# Patient Record
Sex: Female | Born: 1940
Health system: Southern US, Community
[De-identification: ages and names within clinical notes are randomized; demographics above are authoritative.]

## PROBLEM LIST (undated history)

## (undated) DIAGNOSIS — M8580 Other specified disorders of bone density and structure, unspecified site: Secondary | ICD-10-CM

## (undated) DIAGNOSIS — M1990 Unspecified osteoarthritis, unspecified site: Secondary | ICD-10-CM

## (undated) DIAGNOSIS — F419 Anxiety disorder, unspecified: Secondary | ICD-10-CM

## (undated) DIAGNOSIS — N329 Bladder disorder, unspecified: Secondary | ICD-10-CM

## (undated) DIAGNOSIS — E039 Hypothyroidism, unspecified: Secondary | ICD-10-CM

## (undated) DIAGNOSIS — J45909 Unspecified asthma, uncomplicated: Secondary | ICD-10-CM

## (undated) DIAGNOSIS — F32A Depression, unspecified: Secondary | ICD-10-CM

## (undated) DIAGNOSIS — G4700 Insomnia, unspecified: Secondary | ICD-10-CM

## (undated) DIAGNOSIS — F329 Major depressive disorder, single episode, unspecified: Secondary | ICD-10-CM

## (undated) DIAGNOSIS — E079 Disorder of thyroid, unspecified: Secondary | ICD-10-CM

## (undated) DIAGNOSIS — J309 Allergic rhinitis, unspecified: Secondary | ICD-10-CM

## (undated) HISTORY — DX: Other specified disorders of bone density and structure, unspecified site: M85.80

## (undated) HISTORY — DX: Depression, unspecified: F32.A

## (undated) HISTORY — DX: Allergic rhinitis, unspecified: J30.9

## (undated) HISTORY — DX: Unspecified asthma, uncomplicated: J45.909

## (undated) HISTORY — DX: Bladder disorder, unspecified: N32.9

## (undated) HISTORY — DX: Anxiety disorder, unspecified: F41.9

## (undated) HISTORY — DX: Insomnia, unspecified: G47.00

## (undated) HISTORY — DX: Unspecified osteoarthritis, unspecified site: M19.90

## (undated) HISTORY — DX: Hypothyroidism, unspecified: E03.9

## (undated) HISTORY — PX: COLONOSCOPY: SHX174

## (undated) HISTORY — DX: Disorder of thyroid, unspecified: E07.9

---
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
## 1999-07-17 ENCOUNTER — Encounter: Admission: RE | Admit: 1999-07-17 | Discharge: 1999-07-17 | Payer: Self-pay | Admitting: Obstetrics and Gynecology

## 1999-07-17 ENCOUNTER — Encounter: Payer: Self-pay | Admitting: Obstetrics and Gynecology

## 1999-07-22 ENCOUNTER — Encounter: Admission: RE | Admit: 1999-07-22 | Discharge: 1999-07-22 | Payer: Self-pay | Admitting: Internal Medicine

## 1999-07-22 ENCOUNTER — Encounter (HOSPITAL_BASED_OUTPATIENT_CLINIC_OR_DEPARTMENT_OTHER): Payer: Self-pay | Admitting: Internal Medicine

## 2000-07-22 ENCOUNTER — Encounter: Admission: RE | Admit: 2000-07-22 | Discharge: 2000-07-22 | Payer: Self-pay | Admitting: Obstetrics and Gynecology

## 2000-07-22 ENCOUNTER — Encounter: Payer: Self-pay | Admitting: Obstetrics and Gynecology

## 2000-08-18 ENCOUNTER — Other Ambulatory Visit: Admission: RE | Admit: 2000-08-18 | Discharge: 2000-08-18 | Payer: Self-pay | Admitting: Obstetrics and Gynecology

## 2001-07-24 ENCOUNTER — Encounter: Payer: Self-pay | Admitting: Obstetrics and Gynecology

## 2001-07-24 ENCOUNTER — Encounter: Admission: RE | Admit: 2001-07-24 | Discharge: 2001-07-24 | Payer: Self-pay | Admitting: Obstetrics and Gynecology

## 2001-08-04 ENCOUNTER — Other Ambulatory Visit: Admission: RE | Admit: 2001-08-04 | Discharge: 2001-08-04 | Payer: Self-pay | Admitting: Obstetrics and Gynecology

## 2002-01-01 ENCOUNTER — Ambulatory Visit (HOSPITAL_COMMUNITY): Admission: RE | Admit: 2002-01-01 | Discharge: 2002-01-01 | Payer: Self-pay | Admitting: Internal Medicine

## 2002-01-02 ENCOUNTER — Ambulatory Visit (HOSPITAL_COMMUNITY): Admission: RE | Admit: 2002-01-02 | Discharge: 2002-01-02 | Payer: Self-pay | Admitting: Internal Medicine

## 2002-05-03 ENCOUNTER — Encounter: Admission: RE | Admit: 2002-05-03 | Discharge: 2002-05-03 | Payer: Self-pay | Admitting: Obstetrics and Gynecology

## 2002-05-03 ENCOUNTER — Encounter: Payer: Self-pay | Admitting: Obstetrics and Gynecology

## 2003-05-06 ENCOUNTER — Encounter: Payer: Self-pay | Admitting: Obstetrics and Gynecology

## 2003-05-06 ENCOUNTER — Encounter: Admission: RE | Admit: 2003-05-06 | Discharge: 2003-05-06 | Payer: Self-pay | Admitting: Obstetrics and Gynecology

## 2004-06-19 ENCOUNTER — Encounter: Admission: RE | Admit: 2004-06-19 | Discharge: 2004-06-19 | Payer: Self-pay | Admitting: Obstetrics and Gynecology

## 2004-08-10 ENCOUNTER — Ambulatory Visit: Payer: Self-pay | Admitting: Gastroenterology

## 2004-08-24 ENCOUNTER — Ambulatory Visit: Payer: Self-pay | Admitting: Gastroenterology

## 2005-06-22 ENCOUNTER — Encounter: Admission: RE | Admit: 2005-06-22 | Discharge: 2005-06-22 | Payer: Self-pay | Admitting: Obstetrics and Gynecology

## 2006-06-28 ENCOUNTER — Encounter: Admission: RE | Admit: 2006-06-28 | Discharge: 2006-06-28 | Payer: Self-pay | Admitting: Obstetrics and Gynecology

## 2007-07-04 ENCOUNTER — Encounter: Admission: RE | Admit: 2007-07-04 | Discharge: 2007-07-04 | Payer: Self-pay | Admitting: Obstetrics and Gynecology

## 2008-07-04 ENCOUNTER — Encounter: Admission: RE | Admit: 2008-07-04 | Discharge: 2008-07-04 | Payer: Self-pay | Admitting: Obstetrics and Gynecology

## 2009-09-19 ENCOUNTER — Encounter: Admission: RE | Admit: 2009-09-19 | Discharge: 2009-09-19 | Payer: Self-pay | Admitting: Obstetrics and Gynecology

## 2009-09-22 ENCOUNTER — Encounter: Admission: RE | Admit: 2009-09-22 | Discharge: 2009-09-22 | Payer: Self-pay | Admitting: Obstetrics and Gynecology

## 2010-09-30 ENCOUNTER — Encounter
Admission: RE | Admit: 2010-09-30 | Discharge: 2010-09-30 | Payer: Self-pay | Source: Home / Self Care | Attending: Obstetrics and Gynecology | Admitting: Obstetrics and Gynecology

## 2010-10-30 ENCOUNTER — Telehealth: Payer: Self-pay | Admitting: Gastroenterology

## 2010-11-09 ENCOUNTER — Encounter: Payer: Self-pay | Admitting: Gastroenterology

## 2010-11-19 NOTE — Letter (Signed)
Summary: Pre Visit Letter Revised  [facility]  [address] [place], [place] [address]   Phone: [phone]  Fax: [phone]        11/09/2010 MRN: [id number] [facility] [address] [place], [place]  [address]             Procedure Date:  01/08/11  Welcome to the Gastroenterology Division at [facility].    You are scheduled to see a nurse for your pre-procedure visit on 12/25/10 at 10:00 am on the 3rd floor at [facility], [address]  We ask that you try to arrive at our office 15 minutes prior to your appointment time to allow for check-in.  Please take a minute to review the attached form.  If you answer "Yes" to one or more of the questions on the first page, we ask that you call the person listed at your earliest opportunity.  If you answer "No" to all of the questions, please complete the rest of the form and bring it to your appointment.    Your nurse visit will consist of discussing your medical and surgical history, your immediate family medical history, and your medications.   If you are unable to list all of your medications on the form, please bring the medication bottles to your appointment and we will list them.  We will need to be aware of both prescribed and over the counter drugs.  We will need to know exact dosage information as well.    Please be prepared to read and sign documents such as consent forms, a financial agreement, and acknowledgement forms.  If necessary, and with your consent, a friend or relative is welcome to sit-in on the nurse visit with you.  Please bring your insurance card so that we may make a copy of it.  If your insurance requires a referral to see a specialist, please bring your referral form from your primary care physician.  No co-pay is required for this nurse visit.     If you cannot keep your appointment, please call [phone] to cancel or reschedule prior to your appointment date.  This  allows [place] the opportunity to schedule an appointment for another patient in need of care.    Thank you for choosing [facility] for your medical needs.  We appreciate the opportunity to care for you.  Please visit [place] at our website  to learn more about our practice.  Sincerely, The Gastroenterology Division

## 2010-11-19 NOTE — Progress Notes (Signed)
Summary: Questions  Phone Note Call from Patient Call back at Home Phone [phone]   Caller: Patient Call For: Dr. Russella Dar Reason for Call: Talk to Nurse Summary of Call: Patient says her last colon report from 2005 says to come back in 2012 and the recall in the computer says 2015, wants to speak with a nurse about the possibility of this being a computer error and see if she does need to schedule colon now Initial call taken by: [place] Johnson,  October 30, 2010 3:18 PM  Follow-up for Phone Call        paper chart has been ordered for review Darcey Nora RN, [facility]  November 02, 2010 3:30 PM  I spoke with the patient she is concerned because her brother had colon CA at age 70.  Last colon was 08/2004 showed only internal hems.  Please advise if recall is still for 08/2014.  I will put the paper chart in your office to review Follow-up by: Darcey Nora RN, CGRN,  November 04, 2010 9:03 AM  Additional Follow-up for Phone Call Additional follow up Details #1::        Records reviewed. At 08/2004 colonoscopy she was a routine risk screening, we did not have a family history of colon cancer.  Since she now reports she has a brother at 81 with colon cancer she is due at 5 years from her prior exam which is overdue. Please direct schedule her colonoscopy. Additional Follow-up by: Meryl Dare MD Clementeen Graham,  November 04, 2010 12:02 PM    Additional Follow-up for Phone Call Additional follow up Details #2::    Left message for patient to call back Darcey Nora RN, [facility]  November 05, 2010 2:58 PM Left message for patient to call back Darcey Nora RN, [facility]  November 06, 2010 4:28 PM  Patient is scheduled for colon 01/08/11 8:30 [facility], pre-visit is scheduled for 12/25/10 10:00 Follow-up by: Darcey Nora RN, CGRN,  November 09, 2010 12:10 PM

## 2010-12-25 ENCOUNTER — Ambulatory Visit (AMBULATORY_SURGERY_CENTER): Payer: Medicare Other | Admitting: *Deleted

## 2010-12-25 VITALS — Ht 60.0 in | Wt 113.9 lb

## 2010-12-25 DIAGNOSIS — Z800 Family history of malignant neoplasm of digestive organs: Secondary | ICD-10-CM

## 2010-12-25 MED ORDER — PEG-KCL-NACL-NASULF-NA ASC-C 100 G PO SOLR: ORAL | Status: DC

## 2011-01-08 ENCOUNTER — Ambulatory Visit (AMBULATORY_SURGERY_CENTER): Payer: Medicare Other | Admitting: Gastroenterology

## 2011-01-08 ENCOUNTER — Encounter: Payer: Self-pay | Admitting: Gastroenterology

## 2011-01-08 DIAGNOSIS — K648 Other hemorrhoids: Secondary | ICD-10-CM

## 2011-01-08 DIAGNOSIS — Z1211 Encounter for screening for malignant neoplasm of colon: Secondary | ICD-10-CM

## 2011-01-08 DIAGNOSIS — Z800 Family history of malignant neoplasm of digestive organs: Secondary | ICD-10-CM

## 2011-01-08 DIAGNOSIS — K5730 Diverticulosis of large intestine without perforation or abscess without bleeding: Secondary | ICD-10-CM

## 2011-01-08 MED ORDER — SODIUM CHLORIDE 0.9 % IV SOLN: 500.0000 mL | INTRAVENOUS | Status: DC

## 2011-01-08 NOTE — Patient Instructions (Signed)
Refer to green and blue discharge instruction sheets. 

## 2011-01-08 NOTE — Progress Notes (Signed)
0454 MS gave verbal and written report to pt's care partner.

## 2011-01-11 ENCOUNTER — Telehealth: Payer: Self-pay | Admitting: *Deleted

## 2011-01-11 NOTE — Telephone Encounter (Signed)

## 2011-09-23 ENCOUNTER — Other Ambulatory Visit: Payer: Self-pay | Admitting: Obstetrics and Gynecology

## 2011-09-23 DIAGNOSIS — N63 Unspecified lump in unspecified breast: Secondary | ICD-10-CM

## 2011-10-04 ENCOUNTER — Ambulatory Visit
Admission: RE | Admit: 2011-10-04 | Discharge: 2011-10-04 | Disposition: A | Discharge status: Home / Self Care | Payer: Medicare Other | Source: Ambulatory Visit | Attending: Obstetrics and Gynecology | Admitting: Obstetrics and Gynecology

## 2011-10-04 DIAGNOSIS — N63 Unspecified lump in unspecified breast: Secondary | ICD-10-CM

## 2011-10-04 DIAGNOSIS — R928 Other abnormal and inconclusive findings on diagnostic imaging of breast: Secondary | ICD-10-CM | POA: Diagnosis not present

## 2011-10-08 DIAGNOSIS — Z0000 Encounter for general adult medical examination without abnormal findings: Secondary | ICD-10-CM | POA: Diagnosis not present

## 2011-10-08 DIAGNOSIS — Z124 Encounter for screening for malignant neoplasm of cervix: Secondary | ICD-10-CM | POA: Diagnosis not present

## 2011-10-12 DIAGNOSIS — E039 Hypothyroidism, unspecified: Secondary | ICD-10-CM | POA: Diagnosis not present

## 2011-10-12 DIAGNOSIS — E785 Hyperlipidemia, unspecified: Secondary | ICD-10-CM | POA: Diagnosis not present

## 2011-10-12 DIAGNOSIS — Z780 Asymptomatic menopausal state: Secondary | ICD-10-CM | POA: Diagnosis not present

## 2011-10-12 DIAGNOSIS — G4700 Insomnia, unspecified: Secondary | ICD-10-CM | POA: Diagnosis not present

## 2011-10-12 DIAGNOSIS — R82998 Other abnormal findings in urine: Secondary | ICD-10-CM | POA: Diagnosis not present

## 2011-10-19 DIAGNOSIS — G4700 Insomnia, unspecified: Secondary | ICD-10-CM | POA: Diagnosis not present

## 2011-10-19 DIAGNOSIS — J45909 Unspecified asthma, uncomplicated: Secondary | ICD-10-CM | POA: Diagnosis not present

## 2011-10-19 DIAGNOSIS — E785 Hyperlipidemia, unspecified: Secondary | ICD-10-CM | POA: Diagnosis not present

## 2011-10-19 DIAGNOSIS — Z0000 Encounter for general adult medical examination without abnormal findings: Secondary | ICD-10-CM | POA: Diagnosis not present

## 2011-11-03 DIAGNOSIS — D485 Neoplasm of uncertain behavior of skin: Secondary | ICD-10-CM | POA: Diagnosis not present

## 2011-11-03 DIAGNOSIS — L821 Other seborrheic keratosis: Secondary | ICD-10-CM | POA: Diagnosis not present

## 2011-11-03 DIAGNOSIS — C44319 Basal cell carcinoma of skin of other parts of face: Secondary | ICD-10-CM | POA: Diagnosis not present

## 2011-11-03 DIAGNOSIS — C44519 Basal cell carcinoma of skin of other part of trunk: Secondary | ICD-10-CM | POA: Diagnosis not present

## 2011-11-03 DIAGNOSIS — D239 Other benign neoplasm of skin, unspecified: Secondary | ICD-10-CM | POA: Diagnosis not present

## 2011-11-05 DIAGNOSIS — C44319 Basal cell carcinoma of skin of other parts of face: Secondary | ICD-10-CM | POA: Diagnosis not present

## 2011-11-05 DIAGNOSIS — C44519 Basal cell carcinoma of skin of other part of trunk: Secondary | ICD-10-CM | POA: Diagnosis not present

## 2011-11-17 DIAGNOSIS — H18899 Other specified disorders of cornea, unspecified eye: Secondary | ICD-10-CM | POA: Diagnosis not present

## 2011-11-17 DIAGNOSIS — H5710 Ocular pain, unspecified eye: Secondary | ICD-10-CM | POA: Diagnosis not present

## 2011-11-24 DIAGNOSIS — C44519 Basal cell carcinoma of skin of other part of trunk: Secondary | ICD-10-CM | POA: Diagnosis not present

## 2011-12-01 DIAGNOSIS — H04129 Dry eye syndrome of unspecified lacrimal gland: Secondary | ICD-10-CM | POA: Diagnosis not present

## 2011-12-01 DIAGNOSIS — D487 Neoplasm of uncertain behavior of other specified sites: Secondary | ICD-10-CM | POA: Diagnosis not present

## 2011-12-06 DIAGNOSIS — C44319 Basal cell carcinoma of skin of other parts of face: Secondary | ICD-10-CM | POA: Diagnosis not present

## 2011-12-08 DIAGNOSIS — L570 Actinic keratosis: Secondary | ICD-10-CM | POA: Diagnosis not present

## 2012-03-29 DIAGNOSIS — L909 Atrophic disorder of skin, unspecified: Secondary | ICD-10-CM | POA: Diagnosis not present

## 2012-03-29 DIAGNOSIS — L570 Actinic keratosis: Secondary | ICD-10-CM | POA: Diagnosis not present

## 2012-03-29 DIAGNOSIS — D239 Other benign neoplasm of skin, unspecified: Secondary | ICD-10-CM | POA: Diagnosis not present

## 2012-03-29 DIAGNOSIS — Z85828 Personal history of other malignant neoplasm of skin: Secondary | ICD-10-CM | POA: Diagnosis not present

## 2012-03-29 DIAGNOSIS — L820 Inflamed seborrheic keratosis: Secondary | ICD-10-CM | POA: Diagnosis not present

## 2012-03-29 DIAGNOSIS — L910 Hypertrophic scar: Secondary | ICD-10-CM | POA: Diagnosis not present

## 2012-04-18 DIAGNOSIS — M949 Disorder of cartilage, unspecified: Secondary | ICD-10-CM | POA: Diagnosis not present

## 2012-04-18 DIAGNOSIS — E785 Hyperlipidemia, unspecified: Secondary | ICD-10-CM | POA: Diagnosis not present

## 2012-04-18 DIAGNOSIS — Z23 Encounter for immunization: Secondary | ICD-10-CM | POA: Diagnosis not present

## 2012-04-18 DIAGNOSIS — M899 Disorder of bone, unspecified: Secondary | ICD-10-CM | POA: Diagnosis not present

## 2012-04-18 DIAGNOSIS — E039 Hypothyroidism, unspecified: Secondary | ICD-10-CM | POA: Diagnosis not present

## 2012-05-17 DIAGNOSIS — L719 Rosacea, unspecified: Secondary | ICD-10-CM | POA: Diagnosis not present

## 2012-05-17 DIAGNOSIS — C44519 Basal cell carcinoma of skin of other part of trunk: Secondary | ICD-10-CM | POA: Diagnosis not present

## 2012-05-17 DIAGNOSIS — L570 Actinic keratosis: Secondary | ICD-10-CM | POA: Diagnosis not present

## 2012-05-17 DIAGNOSIS — L219 Seborrheic dermatitis, unspecified: Secondary | ICD-10-CM | POA: Diagnosis not present

## 2012-06-14 DIAGNOSIS — C44519 Basal cell carcinoma of skin of other part of trunk: Secondary | ICD-10-CM | POA: Diagnosis not present

## 2012-07-12 DIAGNOSIS — Z23 Encounter for immunization: Secondary | ICD-10-CM | POA: Diagnosis not present

## 2012-09-21 ENCOUNTER — Other Ambulatory Visit: Payer: Self-pay | Admitting: Obstetrics and Gynecology

## 2012-09-21 DIAGNOSIS — Z1231 Encounter for screening mammogram for malignant neoplasm of breast: Secondary | ICD-10-CM

## 2012-09-21 DIAGNOSIS — Z853 Personal history of malignant neoplasm of breast: Secondary | ICD-10-CM

## 2012-10-05 ENCOUNTER — Ambulatory Visit
Admission: RE | Admit: 2012-10-05 | Discharge: 2012-10-05 | Disposition: A | Discharge status: Home / Self Care | Payer: Medicare Other | Source: Ambulatory Visit | Attending: Obstetrics and Gynecology | Admitting: Obstetrics and Gynecology

## 2012-10-05 DIAGNOSIS — Z1231 Encounter for screening mammogram for malignant neoplasm of breast: Secondary | ICD-10-CM

## 2012-10-05 DIAGNOSIS — Z853 Personal history of malignant neoplasm of breast: Secondary | ICD-10-CM

## 2012-10-12 DIAGNOSIS — Z01419 Encounter for gynecological examination (general) (routine) without abnormal findings: Secondary | ICD-10-CM | POA: Diagnosis not present

## 2012-10-12 DIAGNOSIS — Z124 Encounter for screening for malignant neoplasm of cervix: Secondary | ICD-10-CM | POA: Diagnosis not present

## 2012-10-12 DIAGNOSIS — Z0000 Encounter for general adult medical examination without abnormal findings: Secondary | ICD-10-CM | POA: Diagnosis not present

## 2012-10-19 DIAGNOSIS — D485 Neoplasm of uncertain behavior of skin: Secondary | ICD-10-CM | POA: Diagnosis not present

## 2012-10-19 DIAGNOSIS — D235 Other benign neoplasm of skin of trunk: Secondary | ICD-10-CM | POA: Diagnosis not present

## 2012-10-19 DIAGNOSIS — L84 Corns and callosities: Secondary | ICD-10-CM | POA: Diagnosis not present

## 2012-10-19 DIAGNOSIS — L570 Actinic keratosis: Secondary | ICD-10-CM | POA: Diagnosis not present

## 2012-10-19 DIAGNOSIS — Z85828 Personal history of other malignant neoplasm of skin: Secondary | ICD-10-CM | POA: Diagnosis not present

## 2012-10-31 DIAGNOSIS — M949 Disorder of cartilage, unspecified: Secondary | ICD-10-CM | POA: Diagnosis not present

## 2012-10-31 DIAGNOSIS — E039 Hypothyroidism, unspecified: Secondary | ICD-10-CM | POA: Diagnosis not present

## 2012-10-31 DIAGNOSIS — R82998 Other abnormal findings in urine: Secondary | ICD-10-CM | POA: Diagnosis not present

## 2012-10-31 DIAGNOSIS — M899 Disorder of bone, unspecified: Secondary | ICD-10-CM | POA: Diagnosis not present

## 2012-10-31 DIAGNOSIS — E785 Hyperlipidemia, unspecified: Secondary | ICD-10-CM | POA: Diagnosis not present

## 2012-11-02 DIAGNOSIS — L570 Actinic keratosis: Secondary | ICD-10-CM | POA: Diagnosis not present

## 2012-11-06 DIAGNOSIS — M949 Disorder of cartilage, unspecified: Secondary | ICD-10-CM | POA: Diagnosis not present

## 2012-11-06 DIAGNOSIS — R32 Unspecified urinary incontinence: Secondary | ICD-10-CM | POA: Diagnosis not present

## 2012-11-06 DIAGNOSIS — Z0000 Encounter for general adult medical examination without abnormal findings: Secondary | ICD-10-CM | POA: Diagnosis not present

## 2012-11-06 DIAGNOSIS — M899 Disorder of bone, unspecified: Secondary | ICD-10-CM | POA: Diagnosis not present

## 2012-11-06 DIAGNOSIS — G4700 Insomnia, unspecified: Secondary | ICD-10-CM | POA: Diagnosis not present

## 2012-11-10 DIAGNOSIS — Z1212 Encounter for screening for malignant neoplasm of rectum: Secondary | ICD-10-CM | POA: Diagnosis not present

## 2012-12-07 DIAGNOSIS — H2510 Age-related nuclear cataract, unspecified eye: Secondary | ICD-10-CM | POA: Diagnosis not present

## 2012-12-07 DIAGNOSIS — H43819 Vitreous degeneration, unspecified eye: Secondary | ICD-10-CM | POA: Diagnosis not present

## 2012-12-07 DIAGNOSIS — H01009 Unspecified blepharitis unspecified eye, unspecified eyelid: Secondary | ICD-10-CM | POA: Diagnosis not present

## 2012-12-07 DIAGNOSIS — H04129 Dry eye syndrome of unspecified lacrimal gland: Secondary | ICD-10-CM | POA: Diagnosis not present

## 2013-05-24 DIAGNOSIS — J45909 Unspecified asthma, uncomplicated: Secondary | ICD-10-CM | POA: Diagnosis not present

## 2013-05-24 DIAGNOSIS — E039 Hypothyroidism, unspecified: Secondary | ICD-10-CM | POA: Diagnosis not present

## 2013-05-24 DIAGNOSIS — Z1331 Encounter for screening for depression: Secondary | ICD-10-CM | POA: Diagnosis not present

## 2013-05-24 DIAGNOSIS — J309 Allergic rhinitis, unspecified: Secondary | ICD-10-CM | POA: Diagnosis not present

## 2013-05-24 DIAGNOSIS — M1990 Unspecified osteoarthritis, unspecified site: Secondary | ICD-10-CM | POA: Diagnosis not present

## 2013-05-24 DIAGNOSIS — Z23 Encounter for immunization: Secondary | ICD-10-CM | POA: Diagnosis not present

## 2013-05-24 DIAGNOSIS — E785 Hyperlipidemia, unspecified: Secondary | ICD-10-CM | POA: Diagnosis not present

## 2013-05-24 DIAGNOSIS — F3289 Other specified depressive episodes: Secondary | ICD-10-CM | POA: Diagnosis not present

## 2013-05-24 DIAGNOSIS — F329 Major depressive disorder, single episode, unspecified: Secondary | ICD-10-CM | POA: Diagnosis not present

## 2013-09-11 ENCOUNTER — Other Ambulatory Visit: Payer: Self-pay

## 2013-09-11 DIAGNOSIS — Z1231 Encounter for screening mammogram for malignant neoplasm of breast: Secondary | ICD-10-CM

## 2013-09-14 DIAGNOSIS — D2370 Other benign neoplasm of skin of unspecified lower limb, including hip: Secondary | ICD-10-CM | POA: Diagnosis not present

## 2013-09-14 DIAGNOSIS — D1801 Hemangioma of skin and subcutaneous tissue: Secondary | ICD-10-CM | POA: Diagnosis not present

## 2013-09-14 DIAGNOSIS — D485 Neoplasm of uncertain behavior of skin: Secondary | ICD-10-CM | POA: Diagnosis not present

## 2013-09-14 DIAGNOSIS — L821 Other seborrheic keratosis: Secondary | ICD-10-CM | POA: Diagnosis not present

## 2013-09-14 DIAGNOSIS — L570 Actinic keratosis: Secondary | ICD-10-CM | POA: Diagnosis not present

## 2013-09-14 DIAGNOSIS — L819 Disorder of pigmentation, unspecified: Secondary | ICD-10-CM | POA: Diagnosis not present

## 2013-09-14 DIAGNOSIS — Z85828 Personal history of other malignant neoplasm of skin: Secondary | ICD-10-CM | POA: Diagnosis not present

## 2013-09-14 DIAGNOSIS — D239 Other benign neoplasm of skin, unspecified: Secondary | ICD-10-CM | POA: Diagnosis not present

## 2013-10-08 ENCOUNTER — Ambulatory Visit
Admission: RE | Admit: 2013-10-08 | Discharge: 2013-10-08 | Disposition: A | Discharge status: Home / Self Care | Payer: Medicare Other | Source: Ambulatory Visit

## 2013-10-08 DIAGNOSIS — Z1231 Encounter for screening mammogram for malignant neoplasm of breast: Secondary | ICD-10-CM | POA: Diagnosis not present

## 2013-10-17 DIAGNOSIS — Z124 Encounter for screening for malignant neoplasm of cervix: Secondary | ICD-10-CM | POA: Diagnosis not present

## 2013-10-17 DIAGNOSIS — Z01419 Encounter for gynecological examination (general) (routine) without abnormal findings: Secondary | ICD-10-CM | POA: Diagnosis not present

## 2013-10-17 DIAGNOSIS — Z0000 Encounter for general adult medical examination without abnormal findings: Secondary | ICD-10-CM | POA: Diagnosis not present

## 2013-10-26 ENCOUNTER — Emergency Department (HOSPITAL_COMMUNITY): Payer: Medicare Other

## 2013-10-26 ENCOUNTER — Encounter (HOSPITAL_COMMUNITY): Payer: Self-pay | Admitting: Emergency Medicine

## 2013-10-26 ENCOUNTER — Emergency Department (HOSPITAL_COMMUNITY)
Admission: EM | Admit: 2013-10-26 | Discharge: 2013-10-27 | Disposition: A | Discharge status: Home / Self Care | Payer: Medicare Other | Attending: Emergency Medicine | Admitting: Emergency Medicine

## 2013-10-26 DIAGNOSIS — E079 Disorder of thyroid, unspecified: Secondary | ICD-10-CM | POA: Insufficient documentation

## 2013-10-26 DIAGNOSIS — Z87448 Personal history of other diseases of urinary system: Secondary | ICD-10-CM | POA: Diagnosis not present

## 2013-10-26 DIAGNOSIS — Z8669 Personal history of other diseases of the nervous system and sense organs: Secondary | ICD-10-CM | POA: Diagnosis not present

## 2013-10-26 DIAGNOSIS — Z7982 Long term (current) use of aspirin: Secondary | ICD-10-CM | POA: Insufficient documentation

## 2013-10-26 DIAGNOSIS — Z79899 Other long term (current) drug therapy: Secondary | ICD-10-CM | POA: Diagnosis not present

## 2013-10-26 DIAGNOSIS — R0789 Other chest pain: Secondary | ICD-10-CM | POA: Diagnosis not present

## 2013-10-26 DIAGNOSIS — R079 Chest pain, unspecified: Secondary | ICD-10-CM | POA: Diagnosis not present

## 2013-10-26 LAB — BASIC METABOLIC PANEL
BUN: 17 mg/dL (ref 6–23)
CALCIUM: 9.8 mg/dL (ref 8.4–10.5)
CO2: 23 meq/L (ref 19–32)
CREATININE: 0.63 mg/dL (ref 0.50–1.10)
Chloride: 104 mEq/L (ref 96–112)
GFR calc Af Amer: >90 mL/min (ref >90)
GFR calc non Af Amer: 87 mL/min — ABNORMAL LOW (ref >90)
Glucose, Bld: 98 mg/dL (ref 70–99)
Potassium: 3.8 mEq/L (ref 3.7–5.3)
Sodium: 142 mEq/L (ref 137–147)

## 2013-10-26 LAB — HEPATIC FUNCTION PANEL
ALT: 22 U/L (ref 0–35)
AST: 28 U/L (ref 0–37)
Albumin: 3.9 g/dL (ref 3.5–5.2)
Alkaline Phosphatase: 69 U/L (ref 39–117)
Bilirubin, Direct: <0.2 mg/dL (ref 0.0–0.3)
Total Bilirubin: 0.4 mg/dL (ref 0.3–1.2)
Total Protein: 7.1 g/dL (ref 6.0–8.3)

## 2013-10-26 LAB — CBC
HCT: 37.1 % (ref 36.0–46.0)
HEMOGLOBIN: 12.6 g/dL (ref 12.0–15.0)
MCH: 29.8 pg (ref 26.0–34.0)
MCHC: 34 g/dL (ref 30.0–36.0)
MCV: 87.7 fL (ref 78.0–100.0)
Platelets: 185 10*3/uL (ref 150–400)
RBC: 4.23 MIL/uL (ref 3.87–5.11)
RDW: 13.5 % (ref 11.5–15.5)
WBC: 7.2 10*3/uL (ref 4.0–10.5)

## 2013-10-26 LAB — POCT I-STAT TROPONIN I
Troponin i, poc: 0.01 ng/mL (ref 0.00–0.08)
Troponin i, poc: 0 ng/mL (ref 0.00–0.08)

## 2013-10-26 LAB — LIPASE, BLOOD: Lipase: 23 U/L (ref 11–59)

## 2013-10-26 MED ORDER — ASPIRIN 81 MG PO CHEW
324.0000 mg | CHEWABLE_TABLET | Freq: Once | ORAL | Status: AC
  Administered 2013-10-26: 324 mg via ORAL
  Filled 2013-10-26: qty 4

## 2013-10-26 MED ORDER — NITROGLYCERIN 0.4 MG SL SUBL
0.4000 mg | SUBLINGUAL_TABLET | SUBLINGUAL | Status: DC | PRN
Start: 2013-10-26 — End: 2013-10-27

## 2013-10-26 NOTE — ED Provider Notes (Addendum)
CSN: [id number]     Arrival date & time 10/26/13  1927 History   First MD Initiated Contact with Patient 10/26/13 1956     Chief Complaint  Patient presents with  . Chest Pain     (Consider location/radiation/quality/duration/timing/severity/associated sxs/prior Treatment) Patient is a 73 y.o. female presenting with chest pain. The history is provided by the patient and the spouse.  Chest Pain Pain location:  Substernal area Pain quality: aching and pressure   Pain radiates to:  Mid back Pain radiates to the back: yes   Pain severity:  Mild Onset quality:  Gradual Duration:  24 hours Timing:  Intermittent Progression:  Partially resolved Chronicity:  New Context comment:  Can't seem to find anything that will make it worse Relieved by: better when she gets up and walks around. Worsened by:  Nothing tried Associated symptoms: no abdominal pain, no cough, no diaphoresis, no fever, no nausea, no palpitations, no shortness of breath, not vomiting and no weakness   Risk factors: no coronary artery disease, no diabetes mellitus, no high cholesterol, no hypertension, no immobilization, no smoking and no surgery     Past Medical History  Diagnosis Date  . Cataract     begining of one  . Thyroid disease   . Bladder problem     bladder leakage at times   Past Surgical History  Procedure Laterality Date  . Wisdom tooth extraction    . Colonoscopy     Family History  Problem Relation Age of Onset  . Colon cancer Brother 36  . Esophageal cancer Maternal Uncle   . Lung cancer Brother   . Cancer Mother   . Stroke Mother   . Heart disease Father    History  Substance Use Topics  . Smoking status: Never Smoker   . Smokeless tobacco: Never Used  . Alcohol Use: No   OB History   Grav Para Term Preterm Abortions TAB SAB Ect Mult Living                 Review of Systems  Constitutional: Negative for fever and diaphoresis.  Respiratory: Negative for cough and shortness of  breath.   Cardiovascular: Positive for chest pain. Negative for palpitations.  Gastrointestinal: Negative for nausea, vomiting and abdominal pain.  Neurological: Negative for weakness.  All other systems reviewed and are negative.      Allergies  Review of patient's allergies indicates no known allergies.  Home Medications   Current Outpatient Rx  Name  Route  Sig  Dispense  Refill  . acetaminophen (TYLENOL) 500 MG tablet   Oral   Take 500 mg by mouth at bedtime.          Marland Kitchen aspirin EC 81 MG tablet   Oral   Take 81 mg by mouth every evening.         . Coenzyme Q10 (COQ10) 100 MG CAPS   Oral   Take 100 mg by mouth daily.         Marland Kitchen desonide (DESOWEN) 0.05 % cream   Topical   Apply 1 application topically daily as needed (for ear itching).         Marland Kitchen guaiFENesin (ROBITUSSIN) 100 MG/5ML liquid   Oral   Take 100 mg by mouth 3 (three) times daily as needed for cough.         Marland Kitchen ibuprofen (ADVIL,MOTRIN) 200 MG tablet   Oral   Take 200 mg by mouth as needed for moderate pain.          Marland Kitchen  levothyroxine (SYNTHROID, LEVOTHROID) 112 MCG tablet   Oral   Take 112 mcg by mouth daily before breakfast.          . LORazepam (ATIVAN) 0.5 MG tablet   Oral   Take 0.5 mg by mouth at bedtime.          . Multiple Vitamins-Minerals (CENTRUM SILVER PO)   Oral   Take 1 tablet by mouth daily.           . Omega-3 Fatty Acids (FISH OIL) 1200 MG CAPS   Oral   Take 1 capsule by mouth 2 (two) times daily.          Vladimir Faster Glycol-Propyl Glycol (SYSTANE) 0.4-0.3 % SOLN   Ophthalmic   Apply 1 drop to eye 4 (four) times daily.          BP 123/55  Pulse 62  Temp(Src) 97.6 F (36.4 C)  Resp 16  SpO2 97% Physical Exam  Nursing note and vitals reviewed. Constitutional: She is oriented to person, place, and time. She appears well-developed and well-nourished. No distress.  HENT:  Head: Normocephalic and atraumatic.  Mouth/Throat: Oropharynx is clear and moist.   Eyes: Conjunctivae and EOM are normal. Pupils are equal, round, and reactive to light.  Neck: Normal range of motion. Neck supple.  Cardiovascular: Normal rate, regular rhythm and intact distal pulses.   No murmur heard. Pulmonary/Chest: Effort normal and breath sounds normal. No respiratory distress. She has no wheezes. She has no rales. She exhibits no tenderness.  Abdominal: Soft. She exhibits no distension. There is no tenderness. There is no rebound and no guarding.  Musculoskeletal: Normal range of motion. She exhibits no edema and no tenderness.  Neurological: She is alert and oriented to person, place, and time.  Skin: Skin is warm and dry. No rash noted. No erythema.  Psychiatric: She has a normal mood and affect. Her behavior is normal.    ED Course  Procedures (including critical care time) Labs Review Labs Reviewed  BASIC METABOLIC PANEL - Abnormal; Notable for the following:    GFR calc non Af Amer 87 (*)    All other components within normal limits  CBC  HEPATIC FUNCTION PANEL  LIPASE, BLOOD  POCT I-STAT TROPONIN I  POCT I-STAT TROPONIN I   Imaging Review Dg Chest Port 1 View  10/26/2013   CLINICAL DATA:  Chest pain.  EXAM: PORTABLE CHEST - 1 VIEW  COMPARISON:  None.  FINDINGS: The heart size and mediastinal contours are within normal limits. Both lungs are clear. No pleural effusion or pneumothorax is noted. The visualized skeletal structures are unremarkable.  IMPRESSION: No acute cardiopulmonary abnormality seen.   Electronically Signed   By: Sabino Dick M.D.   On: 10/26/2013 20:14    EKG Interpretation    Date/Time:  Friday October 26 2013 19:38:05 EST Ventricular Rate:  68 PR Interval:  130 QRS Duration: 96 QT Interval:  370 QTC Calculation: 393 R Axis:   -56 Text Interpretation:  Normal sinus rhythm Incomplete right bundle branch block Left anterior fascicular block No previous tracing Confirmed by Maryan Rued  MD, Alexxander Kurt ([id number]) on 10/26/2013 7:55:45  PM            MDM   Final diagnoses:  Atypical chest pain    Patient presents today with a 24-hour history of intermittent substernal chest pain that has a minimal radiation into her back without associated symptoms. Pain seems to be better when she gets up and walks but  eating does not seem to make it worse. She has no abnormal tenderness no prior cardiac history. No risk factors for PE other than her age but no recent immobilization or travel. The pain is not pleuritic were suggestive of a PE at this time. Patient does have a significant history of a 12 pound weight loss in the last one year without trying she is already a small woman. However thyroid levels were checked within the last 6 months and were normal she has no symptoms suggestive of hyperthyroidism and she has a pulse 60-70 and normal BP.  No abd pain on exam concerning for pancreatitis and labs CBC, CMP, lipase, trop are wnl.  Nonspecific t-wave inversion on EKG without old to compare.  Pt is not in acute distress.  She was given ASA and pain resolved prior to any NTG being given.    Will check delta trop.   12:06 AM Second trop neg.  Discussed with Dr. Radene Gunning.  Feel pt can be d/ced home safely and given strict return precautions andwill f/u with [facility] this week.  Blanchie Dessert, MD 10/27/13 5102  Blanchie Dessert, MD 10/27/13 5852

## 2013-10-26 NOTE — ED Notes (Signed)
Repeat ISTAT trop I drawn and taken to mini lab.

## 2013-10-26 NOTE — ED Notes (Signed)
Pt experienced an episode of chest tightness that lasted approx 10 minutes not exceeding 4/10 and did not inform RN.  Pt now states she is pain free.  Instructed to inform RN immediately if any further changes.

## 2013-10-26 NOTE — ED Notes (Signed)
Reports tightness in center of chest that radiates to back since last night.  Denies sob, nausea, vomiting, or any other symptoms.  Pt tearful at triage.  Rates pain 2/10 and states at worst it was 4/10.

## 2013-10-27 NOTE — Discharge Instructions (Signed)
Chest Pain (Nonspecific) °Chest pain has many causes. Your pain could be caused by something serious, such as a heart attack or a blood clot in the lungs. It could also be caused by something less serious, such as a chest bruise or a virus. Follow up with your doctor. More lab tests or other studies may be needed to find the cause of your pain. Most of the time, nonspecific chest pain will improve within 2 to 3 days of rest and mild pain medicine. °HOME CARE °· For chest bruises, you may put ice on the sore area for 15-20 minutes, 03-04 times a day. Do this only if it makes you feel better. °· Put ice in a plastic bag. °· Place a towel between the skin and the bag. °· Rest for the next 2 to 3 days. °· Go back to work if the pain improves. °· See your doctor if the pain lasts longer than 1 to 2 weeks. °· Only take medicine as told by your doctor. °· Quit smoking if you smoke. °GET HELP RIGHT AWAY IF:  °· There is more pain or pain that spreads to the arm, neck, jaw, back, or belly (abdomen). °· You have shortness of breath. °· You cough more than usual or cough up blood. °· You have very bad back or belly pain, feel sick to your stomach (nauseous), or throw up (vomit). °· You have very bad weakness. °· You pass out (faint). °· You have a fever. °Any of these problems may be serious and may be an emergency. Do not wait to see if the problems will go away. Get medical help right away. Call your local emergency services 911 in [place] Do not drive yourself to the hospital. °MAKE SURE YOU:  °· Understand these instructions. °· Will watch this condition. °· Will get help right away if you or your child is not doing well or gets worse. °Document Released: 02/16/2008 Document Revised: 11/22/2011 Document Reviewed: 02/16/2008 °Exit[employer]® Patient Information ©2014 [employer]. ° °

## 2013-11-15 DIAGNOSIS — M899 Disorder of bone, unspecified: Secondary | ICD-10-CM | POA: Diagnosis not present

## 2013-11-15 DIAGNOSIS — R82998 Other abnormal findings in urine: Secondary | ICD-10-CM | POA: Diagnosis not present

## 2013-11-15 DIAGNOSIS — E039 Hypothyroidism, unspecified: Secondary | ICD-10-CM | POA: Diagnosis not present

## 2013-11-15 DIAGNOSIS — E785 Hyperlipidemia, unspecified: Secondary | ICD-10-CM | POA: Diagnosis not present

## 2013-11-15 DIAGNOSIS — M949 Disorder of cartilage, unspecified: Secondary | ICD-10-CM | POA: Diagnosis not present

## 2013-11-22 DIAGNOSIS — Z1212 Encounter for screening for malignant neoplasm of rectum: Secondary | ICD-10-CM | POA: Diagnosis not present

## 2013-11-22 DIAGNOSIS — M949 Disorder of cartilage, unspecified: Secondary | ICD-10-CM | POA: Diagnosis not present

## 2013-11-22 DIAGNOSIS — Z0000 Encounter for general adult medical examination without abnormal findings: Secondary | ICD-10-CM | POA: Diagnosis not present

## 2013-11-22 DIAGNOSIS — F329 Major depressive disorder, single episode, unspecified: Secondary | ICD-10-CM | POA: Diagnosis not present

## 2013-11-22 DIAGNOSIS — R634 Abnormal weight loss: Secondary | ICD-10-CM | POA: Diagnosis not present

## 2013-11-22 DIAGNOSIS — F3289 Other specified depressive episodes: Secondary | ICD-10-CM | POA: Diagnosis not present

## 2013-11-22 DIAGNOSIS — R0789 Other chest pain: Secondary | ICD-10-CM | POA: Diagnosis not present

## 2013-11-22 DIAGNOSIS — E785 Hyperlipidemia, unspecified: Secondary | ICD-10-CM | POA: Diagnosis not present

## 2013-11-22 DIAGNOSIS — M899 Disorder of bone, unspecified: Secondary | ICD-10-CM | POA: Diagnosis not present

## 2013-11-22 DIAGNOSIS — E039 Hypothyroidism, unspecified: Secondary | ICD-10-CM | POA: Diagnosis not present

## 2013-11-22 DIAGNOSIS — J309 Allergic rhinitis, unspecified: Secondary | ICD-10-CM | POA: Diagnosis not present

## 2014-05-03 DIAGNOSIS — N644 Mastodynia: Secondary | ICD-10-CM | POA: Diagnosis not present

## 2014-05-06 DIAGNOSIS — Z1331 Encounter for screening for depression: Secondary | ICD-10-CM | POA: Diagnosis not present

## 2014-05-06 DIAGNOSIS — IMO0002 Reserved for concepts with insufficient information to code with codable children: Secondary | ICD-10-CM | POA: Diagnosis not present

## 2014-05-06 DIAGNOSIS — R0789 Other chest pain: Secondary | ICD-10-CM | POA: Diagnosis not present

## 2014-05-06 DIAGNOSIS — N644 Mastodynia: Secondary | ICD-10-CM | POA: Diagnosis not present

## 2014-05-13 ENCOUNTER — Encounter: Payer: Self-pay | Admitting: *Deleted

## 2014-05-13 ENCOUNTER — Ambulatory Visit (INDEPENDENT_AMBULATORY_CARE_PROVIDER_SITE_OTHER): Payer: Medicare Other | Admitting: Cardiology

## 2014-05-13 VITALS — BP 110/58 | HR 59 | Ht 60.0 in | Wt 100.0 lb

## 2014-05-13 DIAGNOSIS — R072 Precordial pain: Secondary | ICD-10-CM

## 2014-05-13 DIAGNOSIS — R079 Chest pain, unspecified: Secondary | ICD-10-CM

## 2014-05-13 NOTE — Patient Instructions (Signed)
Your physician has requested that you have a stress echocardiogram. For further information please visit [email or web address]. Please follow instruction sheet as given.  Your physician recommends that you schedule a follow-up appointment as needed with Dr Aundra Dubin.

## 2014-05-13 NOTE — Progress Notes (Signed)
Patient ID: Tammy Riley, female   DOB: 07-Feb-1941, 73 y.o.   MRN: [id number] PCP: Dr Tammy Riley  73 yo with history of hypothyroidism presents for evaluation of chest pain.  Patient was in the ER in 2/15 after developing lower central chest pain.  This lasted about 24 hrs.  There was no trigger.  Workup in the ER was negative for cardiac or GI causes and she was sent home.  About a month ago, she developed a severe stabbing pain in her left lateral chest while walking through her house.  This lasted for several hours then improved considerably but did not go away totally for 10 days.  Chest wall was not tender.  She does not remember an upper extremity muscle strain but she is in general very active and does a lot of outdoors activities.  She does yardwork and carries around 50 lb bags of horsefeed.  She does not get chest pain or dyspnea with exertion.  Since the last chest pain episode, she will get rare central chest stinging that will last for a second at a time.  No palpitations or lightheadedness.    Labs (3/15): creatinine 0.7, LDL 95, HDl 52, TGs 105  ECG: NSR, borderline left axis deviation, incomplete RBBB  PMH: 1. Hypothyroidism 2. Chest pain  SH: Married Tammy Riley), nonsmoker, lives in [place].  FH: Mother with CVA in her 39s, father with MI at 55 (was heavy drinker and smoker).   ROS: All systems reviewed and negative except as per HPI.   Current Outpatient Prescriptions  Medication Sig Dispense Refill  . acetaminophen (TYLENOL) 500 MG tablet Take 500 mg by mouth at bedtime.       Marland Kitchen aspirin EC 81 MG tablet Take 81 mg by mouth every evening.      . Coenzyme Q10 (COQ10) 100 MG CAPS Take 100 mg by mouth daily.      Marland Kitchen desonide (DESOWEN) 0.05 % cream Apply 1 application topically daily as needed (for ear itching).      Marland Kitchen levothyroxine (SYNTHROID, LEVOTHROID) 112 MCG tablet Take 112 mcg by mouth daily before breakfast.       . LORazepam (ATIVAN) 0.5 MG tablet Take 0.5 mg by mouth  at bedtime.       . Multiple Vitamins-Minerals (CENTRUM SILVER PO) Take 1 tablet by mouth daily.        . Omega-3 Fatty Acids (FISH OIL) 1200 MG CAPS Take 1 capsule by mouth daily.       Vladimir Faster Glycol-Propyl Glycol (SYSTANE) 0.4-0.3 % SOLN Apply 1 drop to eye 4 (four) times daily.       Current Facility-Administered Medications  Medication Dose Route Frequency Provider Last Rate Last Dose  . 0.9 %  sodium chloride infusion  500 mL Intravenous Continuous Tammy Artist, MD        BP 110/58  Pulse 59  Ht 5' (1.524 m)  Wt 45.36 kg (100 lb)  BMI 19.53 kg/m2 General: NAD Neck: No JVD, no thyromegaly or thyroid nodule.  Lungs: Clear to auscultation bilaterally with normal respiratory effort. CV: Nondisplaced PMI.  Heart regular S1/S2, no S3/S4, no murmur.  No peripheral edema.  No carotid bruit.  Normal pedal pulses.  Abdomen: Soft, nontender, no hepatosplenomegaly, no distention.  Skin: Intact without lesions or rashes.  Neurologic: Alert and oriented x 3.  Psych: Normal affect. Extremities: No clubbing or cyanosis.  HEENT: Normal.   Assessment/Plan: 73 yo without significant cardiac risk factors other than father  with MI at 11 presents with atypical chest pain.  I suspect that her chest pain about 1 month ago may be due to upper body muscle strain given the amount of lifting and carrying that she does.  I am going to arrange a stress echo for risk stratification.  Her recent lipids looked good.   Tammy Riley 05/13/2014

## 2014-05-21 ENCOUNTER — Ambulatory Visit (HOSPITAL_BASED_OUTPATIENT_CLINIC_OR_DEPARTMENT_OTHER): Payer: Medicare Other

## 2014-05-21 ENCOUNTER — Ambulatory Visit (HOSPITAL_COMMUNITY): Payer: Medicare Other | Attending: Cardiology | Admitting: Radiology

## 2014-05-21 DIAGNOSIS — R0989 Other specified symptoms and signs involving the circulatory and respiratory systems: Secondary | ICD-10-CM

## 2014-05-21 DIAGNOSIS — R072 Precordial pain: Secondary | ICD-10-CM | POA: Insufficient documentation

## 2014-05-21 DIAGNOSIS — R079 Chest pain, unspecified: Secondary | ICD-10-CM

## 2014-05-21 NOTE — Progress Notes (Signed)
Stress Echocardiogram performed.  

## 2014-05-22 ENCOUNTER — Other Ambulatory Visit (HOSPITAL_COMMUNITY): Payer: Medicare Other

## 2014-05-30 ENCOUNTER — Other Ambulatory Visit: Payer: Self-pay | Admitting: Internal Medicine

## 2014-05-30 DIAGNOSIS — E039 Hypothyroidism, unspecified: Secondary | ICD-10-CM | POA: Diagnosis not present

## 2014-05-30 DIAGNOSIS — R634 Abnormal weight loss: Secondary | ICD-10-CM | POA: Diagnosis not present

## 2014-05-30 DIAGNOSIS — Z23 Encounter for immunization: Secondary | ICD-10-CM | POA: Diagnosis not present

## 2014-05-30 DIAGNOSIS — R0789 Other chest pain: Secondary | ICD-10-CM

## 2014-05-30 DIAGNOSIS — F3289 Other specified depressive episodes: Secondary | ICD-10-CM | POA: Diagnosis not present

## 2014-05-30 DIAGNOSIS — F329 Major depressive disorder, single episode, unspecified: Secondary | ICD-10-CM | POA: Diagnosis not present

## 2014-05-30 DIAGNOSIS — E785 Hyperlipidemia, unspecified: Secondary | ICD-10-CM | POA: Diagnosis not present

## 2014-05-30 DIAGNOSIS — IMO0002 Reserved for concepts with insufficient information to code with codable children: Secondary | ICD-10-CM | POA: Diagnosis not present

## 2014-06-04 ENCOUNTER — Ambulatory Visit
Admission: RE | Admit: 2014-06-04 | Discharge: 2014-06-04 | Disposition: A | Discharge status: Home / Self Care | Payer: Medicare Other | Source: Ambulatory Visit | Attending: Internal Medicine | Admitting: Internal Medicine

## 2014-06-04 DIAGNOSIS — R0789 Other chest pain: Secondary | ICD-10-CM

## 2014-06-04 DIAGNOSIS — I708 Atherosclerosis of other arteries: Secondary | ICD-10-CM | POA: Diagnosis not present

## 2014-06-04 DIAGNOSIS — R634 Abnormal weight loss: Secondary | ICD-10-CM

## 2014-06-04 DIAGNOSIS — J438 Other emphysema: Secondary | ICD-10-CM | POA: Diagnosis not present

## 2014-06-04 DIAGNOSIS — N1330 Unspecified hydronephrosis: Secondary | ICD-10-CM | POA: Diagnosis not present

## 2014-06-04 DIAGNOSIS — I77819 Aortic ectasia, unspecified site: Secondary | ICD-10-CM | POA: Diagnosis not present

## 2014-06-04 MED ORDER — IOHEXOL 300 MG/ML  SOLN
75.0000 mL | Freq: Once | INTRAMUSCULAR | Status: AC | PRN
  Administered 2014-06-04: 75 mL via INTRAVENOUS

## 2014-06-07 ENCOUNTER — Other Ambulatory Visit (HOSPITAL_COMMUNITY): Payer: Self-pay | Admitting: Urology

## 2014-06-07 DIAGNOSIS — N3941 Urge incontinence: Secondary | ICD-10-CM | POA: Diagnosis not present

## 2014-06-07 DIAGNOSIS — N1330 Unspecified hydronephrosis: Secondary | ICD-10-CM | POA: Diagnosis not present

## 2014-06-07 DIAGNOSIS — Q6239 Other obstructive defects of renal pelvis and ureter: Secondary | ICD-10-CM

## 2014-06-07 DIAGNOSIS — Q6211 Congenital occlusion of ureteropelvic junction: Principal | ICD-10-CM

## 2014-06-17 ENCOUNTER — Ambulatory Visit (HOSPITAL_COMMUNITY)
Admission: RE | Admit: 2014-06-17 | Discharge: 2014-06-17 | Disposition: A | Discharge status: Home / Self Care | Payer: Medicare Other | Source: Ambulatory Visit | Attending: Urology | Admitting: Urology

## 2014-06-17 DIAGNOSIS — Q6239 Other obstructive defects of renal pelvis and ureter: Secondary | ICD-10-CM

## 2014-06-17 DIAGNOSIS — N135 Crossing vessel and stricture of ureter without hydronephrosis: Secondary | ICD-10-CM | POA: Diagnosis not present

## 2014-06-17 DIAGNOSIS — Q6211 Congenital occlusion of ureteropelvic junction: Secondary | ICD-10-CM

## 2014-06-17 DIAGNOSIS — N2889 Other specified disorders of kidney and ureter: Secondary | ICD-10-CM | POA: Diagnosis not present

## 2014-06-17 MED ORDER — TECHNETIUM TC 99M MERTIATIDE
16.0000 | Freq: Once | INTRAVENOUS | Status: AC | PRN
  Administered 2014-06-17: 16 via INTRAVENOUS

## 2014-06-17 MED ORDER — FUROSEMIDE 10 MG/ML IJ SOLN
23.0000 mg | Freq: Once | INTRAMUSCULAR | Status: AC
  Administered 2014-06-17: 23 mg via INTRAVENOUS
  Filled 2014-06-17: qty 4

## 2014-07-22 ENCOUNTER — Ambulatory Visit: Payer: Medicare Other | Admitting: Cardiology

## 2014-07-23 DIAGNOSIS — H18411 Arcus senilis, right eye: Secondary | ICD-10-CM | POA: Diagnosis not present

## 2014-07-23 DIAGNOSIS — H2511 Age-related nuclear cataract, right eye: Secondary | ICD-10-CM | POA: Diagnosis not present

## 2014-07-23 DIAGNOSIS — H18412 Arcus senilis, left eye: Secondary | ICD-10-CM | POA: Diagnosis not present

## 2014-07-23 DIAGNOSIS — H2512 Age-related nuclear cataract, left eye: Secondary | ICD-10-CM | POA: Diagnosis not present

## 2014-07-25 DIAGNOSIS — Z85828 Personal history of other malignant neoplasm of skin: Secondary | ICD-10-CM | POA: Diagnosis not present

## 2014-07-25 DIAGNOSIS — C44311 Basal cell carcinoma of skin of nose: Secondary | ICD-10-CM | POA: Diagnosis not present

## 2014-07-25 DIAGNOSIS — D485 Neoplasm of uncertain behavior of skin: Secondary | ICD-10-CM | POA: Diagnosis not present

## 2014-08-29 DIAGNOSIS — E039 Hypothyroidism, unspecified: Secondary | ICD-10-CM | POA: Diagnosis not present

## 2014-09-02 DIAGNOSIS — C44311 Basal cell carcinoma of skin of nose: Secondary | ICD-10-CM | POA: Diagnosis not present

## 2014-09-02 DIAGNOSIS — Z85828 Personal history of other malignant neoplasm of skin: Secondary | ICD-10-CM | POA: Diagnosis not present

## 2014-09-03 DIAGNOSIS — Z681 Body mass index (BMI) 19 or less, adult: Secondary | ICD-10-CM | POA: Diagnosis not present

## 2014-09-03 DIAGNOSIS — R634 Abnormal weight loss: Secondary | ICD-10-CM | POA: Diagnosis not present

## 2014-09-03 DIAGNOSIS — E46 Unspecified protein-calorie malnutrition: Secondary | ICD-10-CM | POA: Diagnosis not present

## 2014-09-03 DIAGNOSIS — F419 Anxiety disorder, unspecified: Secondary | ICD-10-CM | POA: Diagnosis not present

## 2014-09-03 DIAGNOSIS — F329 Major depressive disorder, single episode, unspecified: Secondary | ICD-10-CM | POA: Diagnosis not present

## 2014-09-13 HISTORY — PX: CATARACT EXTRACTION W/ INTRAOCULAR LENS  IMPLANT, BILATERAL: SHX1307

## 2014-09-16 DIAGNOSIS — H04121 Dry eye syndrome of right lacrimal gland: Secondary | ICD-10-CM | POA: Diagnosis not present

## 2014-09-16 DIAGNOSIS — H04122 Dry eye syndrome of left lacrimal gland: Secondary | ICD-10-CM | POA: Diagnosis not present

## 2014-09-17 ENCOUNTER — Other Ambulatory Visit: Payer: Self-pay

## 2014-09-17 DIAGNOSIS — Z1231 Encounter for screening mammogram for malignant neoplasm of breast: Secondary | ICD-10-CM

## 2014-10-09 ENCOUNTER — Ambulatory Visit
Admission: RE | Admit: 2014-10-09 | Discharge: 2014-10-09 | Disposition: A | Discharge status: Home / Self Care | Payer: Medicare Other | Source: Ambulatory Visit

## 2014-10-09 ENCOUNTER — Encounter (INDEPENDENT_AMBULATORY_CARE_PROVIDER_SITE_OTHER): Payer: Self-pay

## 2014-10-09 DIAGNOSIS — Z1231 Encounter for screening mammogram for malignant neoplasm of breast: Secondary | ICD-10-CM | POA: Diagnosis not present

## 2014-10-14 DIAGNOSIS — F419 Anxiety disorder, unspecified: Secondary | ICD-10-CM | POA: Diagnosis not present

## 2014-10-14 DIAGNOSIS — R110 Nausea: Secondary | ICD-10-CM | POA: Diagnosis not present

## 2014-10-14 DIAGNOSIS — E46 Unspecified protein-calorie malnutrition: Secondary | ICD-10-CM | POA: Diagnosis not present

## 2014-10-14 DIAGNOSIS — Z681 Body mass index (BMI) 19 or less, adult: Secondary | ICD-10-CM | POA: Diagnosis not present

## 2014-10-14 DIAGNOSIS — E039 Hypothyroidism, unspecified: Secondary | ICD-10-CM | POA: Diagnosis not present

## 2014-10-21 IMAGING — NM NM RENAL IMAGING FLOW W/ PHARM
2 series · 12 of 12 positions shown · non-contrast
Comparison: 06/04/2014

CLINICAL DATA: Evaluate UPJ obstruction.

EXAM:
NUCLEAR MEDICINE RENAL SCAN WITH DIURETIC ADMINISTRATION
TECHNIQUE: Radionuclide angiographic and sequential renal images were obtained
after intravenous injection of radiopharmaceutical. Imaging was
continued during slow intravenous injection of Lasix approximately
15 minutes after the start of the examination.
RADIOPHARMACEUTICALS:  16.0 mCi Sc-DDm MAG3

[Series 1: re renal qualitative · 9.51mm/px · 6 of 103 frames shown (1 of 2)]
[frame 9/103]
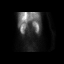
[frame 26/103]
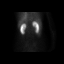
[frame 43/103]
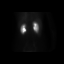
[frame 60/103]
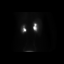
[frame 77/103]
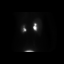
[frame 95/103]
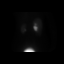

[Series 1: re renal qualitative · 9.51mm/px · 6 of 103 frames shown (2 of 2)]
[frame 9/103]
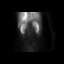
[frame 26/103]
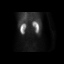
[frame 43/103]
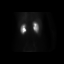
[frame 60/103]
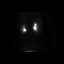
[frame 77/103]
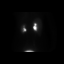
[frame 95/103]
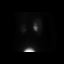

[12 of 12 positions shown; findings below may reference images not displayed]

FINDINGS: Flow:  Prompt symmetric arterial flow to the kidneys.

Left renogram: Normal cortical uptake, excretion and clearance of
the radiopharmaceutical by the left kidney.

Right renogram: Normal cortical uptake by the left kidney. There is
slightly delayed excretion of the radiopharmaceutical. Following
Lasix administration there is diminished clearance of the
radiopharmaceutical from the right renal collecting system.

Differential:

Left kidney = 50.1 %

Right kidney = 49.9 %

T1/2 post Lasix :

Left kidney = 2.5 min

Right kidney = 5.0 min
IMPRESSION: 1. Very low grade partial obstruction of the right kidney without
significant impact upon right renal function.
2. Normally functioning left kidney.
3. Split renal function is equal to 50.1% from the left kidney and
49.9% from the right kidney.

## 2014-10-22 DIAGNOSIS — Z0000 Encounter for general adult medical examination without abnormal findings: Secondary | ICD-10-CM | POA: Diagnosis not present

## 2014-10-22 DIAGNOSIS — Z01419 Encounter for gynecological examination (general) (routine) without abnormal findings: Secondary | ICD-10-CM | POA: Diagnosis not present

## 2014-10-22 DIAGNOSIS — D649 Anemia, unspecified: Secondary | ICD-10-CM | POA: Diagnosis not present

## 2014-10-22 DIAGNOSIS — Z124 Encounter for screening for malignant neoplasm of cervix: Secondary | ICD-10-CM | POA: Diagnosis not present

## 2014-11-05 DIAGNOSIS — H04122 Dry eye syndrome of left lacrimal gland: Secondary | ICD-10-CM | POA: Diagnosis not present

## 2014-11-15 DIAGNOSIS — H25811 Combined forms of age-related cataract, right eye: Secondary | ICD-10-CM | POA: Diagnosis not present

## 2014-11-15 DIAGNOSIS — H2511 Age-related nuclear cataract, right eye: Secondary | ICD-10-CM | POA: Diagnosis not present

## 2014-11-15 DIAGNOSIS — H2512 Age-related nuclear cataract, left eye: Secondary | ICD-10-CM | POA: Diagnosis not present

## 2014-11-18 DIAGNOSIS — N3941 Urge incontinence: Secondary | ICD-10-CM | POA: Diagnosis not present

## 2014-11-18 DIAGNOSIS — Q6211 Congenital occlusion of ureteropelvic junction: Secondary | ICD-10-CM | POA: Diagnosis not present

## 2014-11-18 DIAGNOSIS — N1330 Unspecified hydronephrosis: Secondary | ICD-10-CM | POA: Diagnosis not present

## 2014-11-20 DIAGNOSIS — M859 Disorder of bone density and structure, unspecified: Secondary | ICD-10-CM | POA: Diagnosis not present

## 2014-11-20 DIAGNOSIS — E785 Hyperlipidemia, unspecified: Secondary | ICD-10-CM | POA: Diagnosis not present

## 2014-11-20 DIAGNOSIS — E039 Hypothyroidism, unspecified: Secondary | ICD-10-CM | POA: Diagnosis not present

## 2014-11-20 DIAGNOSIS — Z008 Encounter for other general examination: Secondary | ICD-10-CM | POA: Diagnosis not present

## 2014-11-28 DIAGNOSIS — E46 Unspecified protein-calorie malnutrition: Secondary | ICD-10-CM | POA: Diagnosis not present

## 2014-11-28 DIAGNOSIS — E785 Hyperlipidemia, unspecified: Secondary | ICD-10-CM | POA: Diagnosis not present

## 2014-11-28 DIAGNOSIS — R197 Diarrhea, unspecified: Secondary | ICD-10-CM | POA: Diagnosis not present

## 2014-11-28 DIAGNOSIS — Z1389 Encounter for screening for other disorder: Secondary | ICD-10-CM | POA: Diagnosis not present

## 2014-11-28 DIAGNOSIS — Z6820 Body mass index (BMI) 20.0-20.9, adult: Secondary | ICD-10-CM | POA: Diagnosis not present

## 2014-11-28 DIAGNOSIS — R32 Unspecified urinary incontinence: Secondary | ICD-10-CM | POA: Diagnosis not present

## 2014-11-28 DIAGNOSIS — F419 Anxiety disorder, unspecified: Secondary | ICD-10-CM | POA: Diagnosis not present

## 2014-11-28 DIAGNOSIS — L719 Rosacea, unspecified: Secondary | ICD-10-CM | POA: Diagnosis not present

## 2014-11-28 DIAGNOSIS — M859 Disorder of bone density and structure, unspecified: Secondary | ICD-10-CM | POA: Diagnosis not present

## 2014-11-28 DIAGNOSIS — Z0000 Encounter for general adult medical examination without abnormal findings: Secondary | ICD-10-CM | POA: Diagnosis not present

## 2014-11-28 DIAGNOSIS — I4510 Unspecified right bundle-branch block: Secondary | ICD-10-CM | POA: Diagnosis not present

## 2014-11-28 DIAGNOSIS — E039 Hypothyroidism, unspecified: Secondary | ICD-10-CM | POA: Diagnosis not present

## 2014-12-02 DIAGNOSIS — H18452 Nodular corneal degeneration, left eye: Secondary | ICD-10-CM | POA: Diagnosis not present

## 2014-12-02 DIAGNOSIS — H2512 Age-related nuclear cataract, left eye: Secondary | ICD-10-CM | POA: Diagnosis not present

## 2014-12-02 DIAGNOSIS — H25812 Combined forms of age-related cataract, left eye: Secondary | ICD-10-CM | POA: Diagnosis not present

## 2014-12-04 DIAGNOSIS — Z85828 Personal history of other malignant neoplasm of skin: Secondary | ICD-10-CM | POA: Diagnosis not present

## 2014-12-04 DIAGNOSIS — D2262 Melanocytic nevi of left upper limb, including shoulder: Secondary | ICD-10-CM | POA: Diagnosis not present

## 2014-12-04 DIAGNOSIS — D225 Melanocytic nevi of trunk: Secondary | ICD-10-CM | POA: Diagnosis not present

## 2014-12-04 DIAGNOSIS — L814 Other melanin hyperpigmentation: Secondary | ICD-10-CM | POA: Diagnosis not present

## 2014-12-04 DIAGNOSIS — L821 Other seborrheic keratosis: Secondary | ICD-10-CM | POA: Diagnosis not present

## 2014-12-04 DIAGNOSIS — L570 Actinic keratosis: Secondary | ICD-10-CM | POA: Diagnosis not present

## 2014-12-04 DIAGNOSIS — D1801 Hemangioma of skin and subcutaneous tissue: Secondary | ICD-10-CM | POA: Diagnosis not present

## 2015-08-20 DIAGNOSIS — H903 Sensorineural hearing loss, bilateral: Secondary | ICD-10-CM | POA: Diagnosis not present

## 2015-08-20 DIAGNOSIS — H608X3 Other otitis externa, bilateral: Secondary | ICD-10-CM | POA: Diagnosis not present

## 2015-09-01 ENCOUNTER — Other Ambulatory Visit: Payer: Self-pay

## 2015-09-01 DIAGNOSIS — Z1231 Encounter for screening mammogram for malignant neoplasm of breast: Secondary | ICD-10-CM

## 2015-10-13 ENCOUNTER — Ambulatory Visit
Admission: RE | Admit: 2015-10-13 | Discharge: 2015-10-13 | Disposition: A | Discharge status: Home / Self Care | Payer: Medicare Other | Source: Ambulatory Visit

## 2015-10-13 DIAGNOSIS — Z1231 Encounter for screening mammogram for malignant neoplasm of breast: Secondary | ICD-10-CM | POA: Diagnosis not present

## 2015-10-24 ENCOUNTER — Encounter: Payer: Self-pay | Admitting: Gastroenterology

## 2015-10-30 ENCOUNTER — Encounter: Payer: Self-pay | Admitting: Gastroenterology

## 2015-11-10 DIAGNOSIS — H52223 Regular astigmatism, bilateral: Secondary | ICD-10-CM | POA: Diagnosis not present

## 2015-11-10 DIAGNOSIS — H5203 Hypermetropia, bilateral: Secondary | ICD-10-CM | POA: Diagnosis not present

## 2015-11-10 DIAGNOSIS — H04123 Dry eye syndrome of bilateral lacrimal glands: Secondary | ICD-10-CM | POA: Diagnosis not present

## 2015-11-10 DIAGNOSIS — H524 Presbyopia: Secondary | ICD-10-CM | POA: Diagnosis not present

## 2015-11-10 DIAGNOSIS — Z01419 Encounter for gynecological examination (general) (routine) without abnormal findings: Secondary | ICD-10-CM | POA: Diagnosis not present

## 2015-11-12 ENCOUNTER — Ambulatory Visit: Payer: Medicare Other | Admitting: Psychology

## 2015-11-13 ENCOUNTER — Encounter: Payer: Self-pay | Admitting: Psychology

## 2015-11-13 ENCOUNTER — Ambulatory Visit: Payer: Medicare Other | Attending: Internal Medicine | Admitting: Psychology

## 2015-11-13 DIAGNOSIS — R413 Other amnesia: Secondary | ICD-10-CM | POA: Diagnosis not present

## 2015-11-13 NOTE — Progress Notes (Addendum)
[facility]  [address]   Telephone [phone] [address] Fax [phone] [place], [place] [address]   [place]* This report should not be released without the consent of the client  Name:   Odette Fraction  Date of Birth:  02-17-2041 [facility] MR#:  [id number] Date of Evaluation: 11/13/15   Reason for Referral Tammy Riley is a 75 year-old right-handed woman who was referred for neuropsychological evaluation by Shon Baton MD of [facility] as prompted by concerns about her memory.  Sources of Information Electronic medical records from the [facility] were reviewed. Ms. Kandler, her husband, Mr. Aithana Guin, and her daughter, Ms. Marcene Duos, were interviewed.   Chief Complaints & Current Status According to her daughter and husband, over the past five years or so they have noticed a gradual weakening of Ms. Sites cognitive functioning. They described her as being more easily distracted, less able to multi-task, more often failing to persist task, less able to recall details from recent conversations and more likely to repeat something she told someone the day before. They agreed that she nevertheless has been leading an independent lifestyle without major difficulties completing her instrumental activities of daily living. With regards to her emotional status, they reported that about two years ago she was displaying much irritability towards others that mostly dissipated after she started taking escitalopram. Of note, her irritability was first evident shortly after her son became ill with cancer. She has not appeared sad, unmotivated, preoccupied or unduly anxious. She has not exhibited any confused, bizarre or unsafe behavior.  There was no report of any recent changes in her health, medication usage, level of life stress or social situation. She recently began wearing hearing aids in both ears though  family members have not noticed any subsequent changes in her cognitive functioning.  For her part, Ms. Brozek acknowledged experiencing occasional problems with memory that she attributed to normal aging. She gave recent examples of forgetting to buy something at the store, not recalling why she entered a room and not remembering some details of recent conversations. She denied having difficulty with focus, being easily distracted or being mentally preoccupied. She did not report having had any problems performing her basic and complex activities of daily living, including cooking, driving and managing her finances. Her only complaint was dry eyes. She denied problems with eye-hand coordination, balance, limb strength, gait, daytime alertness, vision, hearing, swallowing, speech, smell, taste or appetite. She reported that her sleep has improved over the past three years to the point where she now averages about six to seven hours of sleep per night. She did not report being in any pain. She described herself as typically in good spirits and leading a relatively stress free lifestyle though prone to worry about her family and the future. She agreed that she sometimes reacts with irritability, typically when things are not done the way she wishes or when she is given too much input at one time. She denied experiencing persisting low mood, apathy, undue anxiety, mood instability, suicidal or homicidal thoughts, hallucinations or delusional ideas.   Background She lives with her husband of 73 five years. She has one surviving daughter. Her son died in 60 from thymus cancer.   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  She reported that she was graduated  from high school and then earned a certificate in Acupuncturist. She reported no history of school-based attentional  or learning difficulties.   Her past medical history was notable for arthritis (hands, shoulders, hip and spine), hyperlipidemia and hypothyroidism. Her current medications include escitalopram, levothyroxine and lorazepam (she reported that she uses this occasionally to promote sleep).  She reported no history of head injury, loss of consciousness, seizure activity, stroke-like symptoms or exposure to toxic chemicals. She reported rare alcohol consumption. She denied use of illicit drugs and tobacco products.   There was no known family medical history of psychiatric disorder or dementia.  She reported no history of serious emotional difficulties or mental health contacts.  Observations She appeared as an appropriately dressed and groomed woman in no apparent distress. She interacted in a pleasant manner. She seemed mildly anxious. Her affect appeared within a wide range and without lability. She had no problems expressing her ideas or understanding conversation. Her thought processes were coherent and organized without loose associations, verbal perseverations or flight of ideas. Her thought content was devoid of unusual or bizarre ideas.  Evaluation Procedures Animal Naming Test  [place] Naming Test Controlled Oral Word Association Test Geriatric Anxiety Scale Geriatric Depression Scale (short form) Modified Wisconsin Card Sorting Test Rey Complex Figure: Copy Trail Making A & B Wechsler Adult Intelligence Scale-IV:   Music therapist, Coding, Digit Span & Similarities  Wechsler Memory Scale-IV: Older Adult Battery Wide Range Achievement Test-4: Word Reading  Assessment Results Test Validity & Interpretive Considerations It was concluded that the test results represented a valid measure of her current cognitive functioning. She did not display signs or physical or emotional distress. She did not report or display problems with vision (she wore her eyeglasses), hearing (she wore hearing  aids in both ears) or motor skills. She was able to comprehend task instructions without difficulty. She appeared to sustain attention and persist to task. She appeared to expend maximal effort.   Her pre-morbid intellectual potential was estimated to fall within the Average range based on her educational/occupational background coupled with a measure of word reading (Wide Range Achievement Test-4), which represents an over-learned verbal skill relatively resistant to the effects of neurological disorder.   Her test scores were corrected to reflect norms for her age and, whenever possible, her gender and educational level (i.e., 13 years). A listing of test results can be found at the end of this report.  Speed of Processing & Attention Her speed to transcribe symbols to match digits using a key (Wechsler Adult Intelligence Scale-IV (WAIS-IV) Coding) or to draw lines to connect randomly arrayed numbers in sequence (Trails A) fell within the Average range. Her auditory working memory span, as measured by her ability to hold and mentally rearrange digits in either reverse or ascending sequence (WAIS-IV Digit Span) fell with the Average. Her visual working memory, as measured by her ability to immediately recognize symbols in left to right order (Wechsler Memory Scale-IV (WMS-IV) Symbol Span) fell within the High Average range.   Learning & Memory Her ability to immediately recall verbal and visual information (WMS-IV Immediate Memory Index) fell at the upper boundary of the Average range at the 75th percentile. She demonstrated a relative strength within the Superior range for learning word pairs via association. Her delayed recall of verbal and visual information, as measured after an approximate twenty to thirty minute delay (WMS-IV Delayed Memory Index), fell within the Very Superior range at the Granite percentile. Her Delayed Memory Index  was better than expected given her Immediate Memory Index, which  indicated that not only did she successfully retain information over the delay interval but that she benefitted from the extra time to more fully consolidate information into memory.  Language A measure of confrontational naming [facility] Naming Test) fell within the High Average range. Her phonemic fluency, based on her ability to generate words to designated letters (Controlled Oral Word Association Test) was within the Average range. Her ability to read words (Wide Range Achievement Test-4: Word Reading) fell within the Average range.  Visual-Spatial Perception & Organization There were no signs of spatial inattention or problems with visual recognition. Her score on a test of visuospatial organization that required assembly of two-dimensional block designs from models (WAIS-IV Block Design) fell within the Average range. Her drawing of a spatially complex geometric figure (Rey Complex Figure) was almost perfect.  Executive Function She performed well within normal expectations on measures of executive functions, which comprise higher level attentional, organizational and reasoning processes that enable a person to successfully initiate, monitor, shift, plan and execute complex behavior. Her speed on a complex visual sequencing test that required set shifting (Trails B) in order to maintain an alternating and ascending number to letter sequence fell within the Average range. As noted above, her phonemic fluency (Controlled Oral Word Association test) fell at the upper limit of the Average range. A measure of semantic fluency (Animal Naming Test) fell within the Very Superior range. Her abstract verbal reasoning, as assessed by her ability to classify ostensibly different objects or ideas to a shared category (WAIS-IV Similarities), fell within the Average range.  Her performance on a test of nonverbal problem-solving [employer]), which required the deducing of rules to sort geometric  designs and shifting to a new strategy when informed that previous strategy was no longer correct, fell within the Superior range.  Emotional Status She endorsed minimal affective distress on standardized symptom questionnaires. On the Geriatric Depression Scale (short form), her score of 0/15 indicated that she did not endorse any items suggestive of depression. On the Geriatric Anxiety Scale, her score of 9 fell within the minimal range.   Summary & Conclusions Ms. Suheily Puett's neuropsychological profile was entirely within normal expectations. Moreover, it was notable that she performed within the Superior to Very Superior ranges on measures of auditory memory, delayed memory, semantic fluency and conceptual reasoning. With regards to her psychological functioning, there was no report of serious emotional difficulties though she acknowledged feeling mildly anxious or irritable at times. She endorsed little to no affective distress on standardized symptom questionnaires.   In conclusion, there was not even a hint of cognitive decline evident on neurocognitive testing.   No specific recommendations are offered. If she or someone who knows her well should perceive in the future a substantial change in her cognitive functioning, then a repeat neuropsychological evaluation using the current results as a baseline could be considered.   I have appreciated the opportunity to evaluate Ms. Leavy. The results of this evaluation were reviewed with Ms. Millis and her daughter via phone calls on 11/13/15. Please feel free to contact me with any comments or questions.   ______________________ Jamey Ripa, Ph.D Licensed Psychologist        Copy to: Shon Baton MD [place]     ADDENDUM-NEUROPSYCHOLOGICAL TEST RESULTS  Animal Naming Test Score= 31 99th (adjusted for age, gender and educational level)   [place] Score= [phone] 84th (adjusted  for age, gender and  educational level)   Controlled Oral Word Association Test Score=  49 words/2 repetitions 75th ((adjusted for age, gender and educational level)   Modified Wisconsin Card Sorting Test  Categories correct     6 82nd (adjusted for age, gender and education)  Perseverative errors    0 96th             Total errors     1 97th         Executive function composite 121 93rd          Rey Complex Figure: copy       Score= 35/36  normal    Trails A Score=  30s  0e 68th (adjusted for age, gender and educational level)  Trails B Score=  82s  0e 68th (adjusted for age, gender and educational level)   Wechsler Adult Intelligence Scale-IV Subtest Scaled Score Percentile  Block Design  11 63rd    Similarities  11 63rd    Digit Span  Forward               Backward               Sequencing    9    8      8  10       37th    25th  25th    50th          Coding    11 63rd         Wechsler Memory Scale-IV Older Adult Battery Index Index Score Percentile  Immediate Memory  110 75th        Auditory Memory  123 94th      Visual Memory  108  70th     Delayed Memory  130   98th       Symbol Span subtest  Scaled score=14  91st      Wide Range Achievement Test-4  Subtest  Raw score Standard score Percentile  Word Reading 59/70 104  61st

## 2015-11-19 ENCOUNTER — Encounter: Payer: Medicare Other | Admitting: Psychology

## 2015-12-04 DIAGNOSIS — M859 Disorder of bone density and structure, unspecified: Secondary | ICD-10-CM | POA: Diagnosis not present

## 2015-12-04 DIAGNOSIS — E784 Other hyperlipidemia: Secondary | ICD-10-CM | POA: Diagnosis not present

## 2015-12-04 DIAGNOSIS — E038 Other specified hypothyroidism: Secondary | ICD-10-CM | POA: Diagnosis not present

## 2015-12-15 DIAGNOSIS — Z1212 Encounter for screening for malignant neoplasm of rectum: Secondary | ICD-10-CM | POA: Diagnosis not present

## 2015-12-17 ENCOUNTER — Ambulatory Visit (AMBULATORY_SURGERY_CENTER): Payer: Self-pay | Admitting: *Deleted

## 2015-12-17 VITALS — Ht 60.0 in | Wt 113.6 lb

## 2015-12-17 DIAGNOSIS — M65341 Trigger finger, right ring finger: Secondary | ICD-10-CM | POA: Diagnosis not present

## 2015-12-17 DIAGNOSIS — Q740 Other congenital malformations of upper limb(s), including shoulder girdle: Secondary | ICD-10-CM | POA: Diagnosis not present

## 2015-12-17 DIAGNOSIS — M47812 Spondylosis without myelopathy or radiculopathy, cervical region: Secondary | ICD-10-CM | POA: Diagnosis not present

## 2015-12-17 DIAGNOSIS — M180 Bilateral primary osteoarthritis of first carpometacarpal joints: Secondary | ICD-10-CM | POA: Diagnosis not present

## 2015-12-17 DIAGNOSIS — M65311 Trigger thumb, right thumb: Secondary | ICD-10-CM | POA: Insufficient documentation

## 2015-12-17 DIAGNOSIS — Z800 Family history of malignant neoplasm of digestive organs: Secondary | ICD-10-CM

## 2015-12-17 MED ORDER — NA SULFATE-K SULFATE-MG SULF 17.5-3.13-1.6 GM/177ML PO SOLN: ORAL | Status: DC

## 2015-12-17 NOTE — Progress Notes (Signed)
No allergies to eggs or soy. No problems with anesthesia.  Pt not given Emmi instructions for colonoscopy; no computer access  No oxygen use  No diet drug use  

## 2015-12-31 ENCOUNTER — Encounter: Payer: Medicare Other | Admitting: Gastroenterology

## 2016-01-05 ENCOUNTER — Ambulatory Visit (AMBULATORY_SURGERY_CENTER): Payer: Medicare Other | Admitting: Gastroenterology

## 2016-01-05 ENCOUNTER — Encounter: Payer: Self-pay | Admitting: Gastroenterology

## 2016-01-05 VITALS — BP 115/94 | HR 62 | Temp 97.8°F | Resp 12 | Ht 60.0 in | Wt 113.0 lb

## 2016-01-05 DIAGNOSIS — F419 Anxiety disorder, unspecified: Secondary | ICD-10-CM | POA: Diagnosis not present

## 2016-01-05 DIAGNOSIS — Z800 Family history of malignant neoplasm of digestive organs: Secondary | ICD-10-CM

## 2016-01-05 DIAGNOSIS — Z1211 Encounter for screening for malignant neoplasm of colon: Secondary | ICD-10-CM | POA: Diagnosis not present

## 2016-01-05 DIAGNOSIS — E079 Disorder of thyroid, unspecified: Secondary | ICD-10-CM | POA: Diagnosis not present

## 2016-01-05 MED ORDER — SODIUM CHLORIDE 0.9 % IV SOLN: 500.0000 mL | INTRAVENOUS | Status: DC

## 2016-01-05 NOTE — Patient Instructions (Signed)
YOU HAD AN ENDOSCOPIC PROCEDURE TODAY AT [facility]:   Refer to the procedure report that was given to you for any specific questions about what was found during the examination.  If the procedure report does not answer your questions, please call your gastroenterologist to clarify.  If you requested that your care partner not be given the details of your procedure findings, then the procedure report has been included in a sealed envelope for you to review at your convenience later.  YOU SHOULD EXPECT: Some feelings of bloating in the abdomen. Passage of more gas than usual.  Walking can help get rid of the air that was put into your GI tract during the procedure and reduce the bloating. If you had a lower endoscopy (such as a colonoscopy or flexible sigmoidoscopy) you may notice spotting of blood in your stool or on the toilet paper. If you underwent a bowel prep for your procedure, you may not have a normal bowel movement for a few days.  Please Note:  You might notice some irritation and congestion in your nose or some drainage.  This is from the oxygen used during your procedure.  There is no need for concern and it should clear up in a day or so.  SYMPTOMS TO REPORT IMMEDIATELY:   Following lower endoscopy (colonoscopy or flexible sigmoidoscopy):  Excessive amounts of blood in the stool  Significant tenderness or worsening of abdominal pains  Swelling of the abdomen that is new, acute  Fever of 100F or higher   For urgent or emergent issues, a gastroenterologist can be reached at any hour by calling [phone].   DIET: Your first meal following the procedure should be a small meal and then it is ok to progress to your normal diet. Heavy or fried foods are harder to digest and may make you feel nauseous or bloated.  Likewise, meals heavy in dairy and vegetables can increase bloating.  Drink plenty of fluids but you should avoid alcoholic beverages for 24 hours.  Try to  increase the fiber in your diet, and drink plenty of water.  ACTIVITY:  You should plan to take it easy for the rest of today and you should NOT DRIVE or use heavy machinery until tomorrow (because of the sedation medicines used during the test).    FOLLOW UP: Our staff will call the number listed on your records the next business day following your procedure to check on you and address any questions or concerns that you may have regarding the information given to you following your procedure. If we do not reach you, we will leave a message.  However, if you are feeling well and you are not experiencing any problems, there is no need to return our call.  We will assume that you have returned to your regular daily activities without incident.  If any biopsies were taken you will be contacted by phone or by letter within the next 1-3 weeks.  Please call us at [phone] if you have not heard about the biopsies in 3 weeks.    SIGNATURES/CONFIDENTIALITY: You and/or your care partner have signed paperwork which will be entered into your electronic medical record.  These signatures attest to the fact that that the information above on your After Visit Summary has been reviewed and is understood.  Full responsibility of the confidentiality of this discharge information lies with you and/or your care-partner.  Thank-you for choosing [place] for your healthcare needs today.

## 2016-01-05 NOTE — Progress Notes (Signed)
Report given to PACU RN, vss 

## 2016-01-05 NOTE — Op Note (Signed)
[facility] Patient Name: Tammy Riley Procedure Date: 01/05/2016 9:19 AM MRN: [id number] Endoscopist: Ladene Artist , MD Age: 75 Date of Birth: 04-13-1941 Gender: Female Procedure:                Colonoscopy Indications:              Screening in patient at increased risk: Family                            history of 1st-degree relative with colorectal                            cancer before age 49 years Medicines:                Monitored Anesthesia Care Procedure:                Pre-Anesthesia Assessment:                           - Prior to the procedure, a History and Physical                            was performed, and patient medications and                            allergies were reviewed. The patient's tolerance of                            previous anesthesia was also reviewed. The risks                            and benefits of the procedure and the sedation                            options and risks were discussed with the patient.                            All questions were answered, and informed consent                            was obtained. Prior Anticoagulants: The patient has                            taken no previous anticoagulant or antiplatelet                            agents. ASA Grade Assessment: II - A patient with                            mild systemic disease. After reviewing the risks                            and benefits, the patient was deemed in  satisfactory condition to undergo the procedure.                           After obtaining informed consent, the colonoscope                            was passed under direct vision. Throughout the                            procedure, the patient's blood pressure, pulse, and                            oxygen saturations were monitored continuously. The                            Model PCF-H190L [id number]) scope was introduced   through the anus and advanced to the the cecum,                            identified by appendiceal orifice and ileocecal                            valve. The colonoscopy was performed without                            difficulty. The patient tolerated the procedure                            well. The quality of the bowel preparation was                            excellent. The ileocecal valve, appendiceal                            orifice, and rectum were photographed. Scope In: 9:30:37 AM Scope Out: 9:43:22 AM Scope Withdrawal Time: 0 hours 8 minutes 28 seconds  Total Procedure Duration: 0 hours 12 minutes 45 seconds  Findings:                 The digital rectal exam was normal.                           A few small-mouthed diverticula were found in the                            sigmoid colon. There was no evidence of                            diverticular bleeding.                           The exam was otherwise normal throughout the                            examined colon.  The retroflexed view of the distal rectum and anal                            verge was normal and showed no anal or rectal                            abnormalities. Complications:            No immediate complications. Estimated Blood Loss:     Estimated blood loss: none. Impression:               - Mild diverticulosis in the sigmoid colon.                           - The distal rectum and anal verge are normal on                            retroflexion view. Recommendation:           - Patient has a contact number available for                            emergencies. The signs and symptoms of potential                            delayed complications were discussed with the                            patient. Return to normal activities tomorrow.                            Written discharge instructions were provided to the                            patient.                            - High fiber diet.                           - Continue present medications.                           - No repeat colonoscopy due to age. Ladene Artist, MD 01/05/2016 9:47:36 AM This report has been signed electronically.

## 2016-01-06 ENCOUNTER — Telehealth: Payer: Self-pay | Admitting: *Deleted

## 2016-01-06 NOTE — Telephone Encounter (Signed)
  Follow up Call-  Call back number 01/05/2016  Post procedure Call Back phone  # [phone]  Permission to leave phone message Yes     Patient questions:  Do you have a fever, pain , or abdominal swelling? No. Pain Score  0 *  Have you tolerated food without any problems? Yes.    Have you been able to return to your normal activities? Yes.    Do you have any questions about your discharge instructions: Diet   No. Medications  No. Follow up visit  No.  Do you have questions or concerns about your Care? No.  Actions: * If pain score is 4 or above: No action needed, pain <4.

## 2016-01-09 DIAGNOSIS — Z0000 Encounter for general adult medical examination without abnormal findings: Secondary | ICD-10-CM | POA: Diagnosis not present

## 2016-01-09 DIAGNOSIS — Q6211 Congenital occlusion of ureteropelvic junction: Secondary | ICD-10-CM | POA: Diagnosis not present

## 2016-01-09 DIAGNOSIS — N1330 Unspecified hydronephrosis: Secondary | ICD-10-CM | POA: Diagnosis not present

## 2016-01-13 DIAGNOSIS — M542 Cervicalgia: Secondary | ICD-10-CM | POA: Diagnosis not present

## 2016-01-15 DIAGNOSIS — M859 Disorder of bone density and structure, unspecified: Secondary | ICD-10-CM | POA: Diagnosis not present

## 2016-01-21 DIAGNOSIS — M65311 Trigger thumb, right thumb: Secondary | ICD-10-CM | POA: Diagnosis not present

## 2016-01-21 DIAGNOSIS — G5602 Carpal tunnel syndrome, left upper limb: Secondary | ICD-10-CM | POA: Diagnosis not present

## 2016-01-21 DIAGNOSIS — M65341 Trigger finger, right ring finger: Secondary | ICD-10-CM | POA: Diagnosis not present

## 2016-01-21 DIAGNOSIS — M47812 Spondylosis without myelopathy or radiculopathy, cervical region: Secondary | ICD-10-CM | POA: Diagnosis not present

## 2016-02-13 DIAGNOSIS — D225 Melanocytic nevi of trunk: Secondary | ICD-10-CM | POA: Diagnosis not present

## 2016-02-13 DIAGNOSIS — L570 Actinic keratosis: Secondary | ICD-10-CM | POA: Diagnosis not present

## 2016-02-13 DIAGNOSIS — I788 Other diseases of capillaries: Secondary | ICD-10-CM | POA: Diagnosis not present

## 2016-02-13 DIAGNOSIS — D224 Melanocytic nevi of scalp and neck: Secondary | ICD-10-CM | POA: Diagnosis not present

## 2016-02-13 DIAGNOSIS — L821 Other seborrheic keratosis: Secondary | ICD-10-CM | POA: Diagnosis not present

## 2016-02-13 DIAGNOSIS — L814 Other melanin hyperpigmentation: Secondary | ICD-10-CM | POA: Diagnosis not present

## 2016-02-13 DIAGNOSIS — Z85828 Personal history of other malignant neoplasm of skin: Secondary | ICD-10-CM | POA: Diagnosis not present

## 2016-02-23 DIAGNOSIS — Q740 Other congenital malformations of upper limb(s), including shoulder girdle: Secondary | ICD-10-CM | POA: Diagnosis not present

## 2016-02-23 DIAGNOSIS — G5603 Carpal tunnel syndrome, bilateral upper limbs: Secondary | ICD-10-CM | POA: Diagnosis not present

## 2016-02-23 DIAGNOSIS — M65311 Trigger thumb, right thumb: Secondary | ICD-10-CM | POA: Diagnosis not present

## 2016-02-23 DIAGNOSIS — M65341 Trigger finger, right ring finger: Secondary | ICD-10-CM | POA: Insufficient documentation

## 2016-02-23 DIAGNOSIS — M180 Bilateral primary osteoarthritis of first carpometacarpal joints: Secondary | ICD-10-CM | POA: Diagnosis not present

## 2016-04-05 DIAGNOSIS — M180 Bilateral primary osteoarthritis of first carpometacarpal joints: Secondary | ICD-10-CM | POA: Diagnosis not present

## 2016-04-05 DIAGNOSIS — M65341 Trigger finger, right ring finger: Secondary | ICD-10-CM | POA: Diagnosis not present

## 2016-04-05 DIAGNOSIS — M65311 Trigger thumb, right thumb: Secondary | ICD-10-CM | POA: Diagnosis not present

## 2016-04-05 DIAGNOSIS — Q740 Other congenital malformations of upper limb(s), including shoulder girdle: Secondary | ICD-10-CM | POA: Diagnosis not present

## 2016-04-05 DIAGNOSIS — M47812 Spondylosis without myelopathy or radiculopathy, cervical region: Secondary | ICD-10-CM | POA: Diagnosis not present

## 2016-05-11 DIAGNOSIS — R5383 Other fatigue: Secondary | ICD-10-CM | POA: Diagnosis not present

## 2016-05-11 DIAGNOSIS — R5381 Other malaise: Secondary | ICD-10-CM | POA: Diagnosis not present

## 2016-05-11 DIAGNOSIS — E038 Other specified hypothyroidism: Secondary | ICD-10-CM | POA: Diagnosis not present

## 2016-05-11 DIAGNOSIS — R413 Other amnesia: Secondary | ICD-10-CM | POA: Diagnosis not present

## 2016-05-11 DIAGNOSIS — E46 Unspecified protein-calorie malnutrition: Secondary | ICD-10-CM | POA: Diagnosis not present

## 2016-07-02 DIAGNOSIS — L820 Inflamed seborrheic keratosis: Secondary | ICD-10-CM | POA: Diagnosis not present

## 2016-07-02 DIAGNOSIS — L814 Other melanin hyperpigmentation: Secondary | ICD-10-CM | POA: Diagnosis not present

## 2016-07-02 DIAGNOSIS — L570 Actinic keratosis: Secondary | ICD-10-CM | POA: Diagnosis not present

## 2016-07-02 DIAGNOSIS — Z85828 Personal history of other malignant neoplasm of skin: Secondary | ICD-10-CM | POA: Diagnosis not present

## 2016-07-02 DIAGNOSIS — L821 Other seborrheic keratosis: Secondary | ICD-10-CM | POA: Diagnosis not present

## 2016-07-02 DIAGNOSIS — D2262 Melanocytic nevi of left upper limb, including shoulder: Secondary | ICD-10-CM | POA: Diagnosis not present

## 2016-07-02 DIAGNOSIS — D225 Melanocytic nevi of trunk: Secondary | ICD-10-CM | POA: Diagnosis not present

## 2016-08-31 DIAGNOSIS — E038 Other specified hypothyroidism: Secondary | ICD-10-CM | POA: Diagnosis not present

## 2016-10-06 ENCOUNTER — Other Ambulatory Visit: Payer: Self-pay | Admitting: Internal Medicine

## 2016-10-06 DIAGNOSIS — Z1231 Encounter for screening mammogram for malignant neoplasm of breast: Secondary | ICD-10-CM

## 2016-10-20 ENCOUNTER — Ambulatory Visit
Admission: RE | Admit: 2016-10-20 | Discharge: 2016-10-20 | Disposition: A | Discharge status: Home / Self Care | Payer: Medicare Other | Source: Ambulatory Visit | Attending: Internal Medicine | Admitting: Internal Medicine

## 2016-10-20 DIAGNOSIS — Z1231 Encounter for screening mammogram for malignant neoplasm of breast: Secondary | ICD-10-CM

## 2016-11-17 ENCOUNTER — Ambulatory Visit: Payer: Medicare Other

## 2016-12-16 DIAGNOSIS — E038 Other specified hypothyroidism: Secondary | ICD-10-CM | POA: Diagnosis not present

## 2016-12-16 DIAGNOSIS — R32 Unspecified urinary incontinence: Secondary | ICD-10-CM | POA: Diagnosis not present

## 2016-12-16 DIAGNOSIS — Z0000 Encounter for general adult medical examination without abnormal findings: Secondary | ICD-10-CM | POA: Diagnosis not present

## 2016-12-16 DIAGNOSIS — M859 Disorder of bone density and structure, unspecified: Secondary | ICD-10-CM | POA: Diagnosis not present

## 2016-12-16 DIAGNOSIS — E784 Other hyperlipidemia: Secondary | ICD-10-CM | POA: Diagnosis not present

## 2016-12-29 DIAGNOSIS — Z1212 Encounter for screening for malignant neoplasm of rectum: Secondary | ICD-10-CM | POA: Diagnosis not present

## 2017-05-12 DIAGNOSIS — L603 Nail dystrophy: Secondary | ICD-10-CM | POA: Diagnosis not present

## 2017-05-12 DIAGNOSIS — D485 Neoplasm of uncertain behavior of skin: Secondary | ICD-10-CM | POA: Diagnosis not present

## 2017-05-12 DIAGNOSIS — D2261 Melanocytic nevi of right upper limb, including shoulder: Secondary | ICD-10-CM | POA: Diagnosis not present

## 2017-05-12 DIAGNOSIS — L821 Other seborrheic keratosis: Secondary | ICD-10-CM | POA: Diagnosis not present

## 2017-05-12 DIAGNOSIS — D2262 Melanocytic nevi of left upper limb, including shoulder: Secondary | ICD-10-CM | POA: Diagnosis not present

## 2017-05-12 DIAGNOSIS — L570 Actinic keratosis: Secondary | ICD-10-CM | POA: Diagnosis not present

## 2017-05-12 DIAGNOSIS — Z85828 Personal history of other malignant neoplasm of skin: Secondary | ICD-10-CM | POA: Diagnosis not present

## 2017-05-12 DIAGNOSIS — L814 Other melanin hyperpigmentation: Secondary | ICD-10-CM | POA: Diagnosis not present

## 2017-05-12 DIAGNOSIS — D225 Melanocytic nevi of trunk: Secondary | ICD-10-CM | POA: Diagnosis not present

## 2017-05-12 DIAGNOSIS — D224 Melanocytic nevi of scalp and neck: Secondary | ICD-10-CM | POA: Diagnosis not present

## 2017-06-22 DIAGNOSIS — Z23 Encounter for immunization: Secondary | ICD-10-CM | POA: Diagnosis not present

## 2017-07-07 DIAGNOSIS — E038 Other specified hypothyroidism: Secondary | ICD-10-CM | POA: Diagnosis not present

## 2017-07-07 DIAGNOSIS — Z6822 Body mass index (BMI) 22.0-22.9, adult: Secondary | ICD-10-CM | POA: Diagnosis not present

## 2017-07-07 DIAGNOSIS — R413 Other amnesia: Secondary | ICD-10-CM | POA: Diagnosis not present

## 2017-07-07 DIAGNOSIS — F325 Major depressive disorder, single episode, in full remission: Secondary | ICD-10-CM | POA: Diagnosis not present

## 2017-07-07 DIAGNOSIS — G4709 Other insomnia: Secondary | ICD-10-CM | POA: Diagnosis not present

## 2017-09-20 ENCOUNTER — Other Ambulatory Visit: Payer: Self-pay | Admitting: Internal Medicine

## 2017-09-20 DIAGNOSIS — Z1231 Encounter for screening mammogram for malignant neoplasm of breast: Secondary | ICD-10-CM

## 2017-10-21 ENCOUNTER — Ambulatory Visit
Admission: RE | Admit: 2017-10-21 | Discharge: 2017-10-21 | Disposition: A | Discharge status: Home / Self Care | Payer: Medicare Other | Source: Ambulatory Visit | Attending: Internal Medicine | Admitting: Internal Medicine

## 2017-10-21 DIAGNOSIS — Z1231 Encounter for screening mammogram for malignant neoplasm of breast: Secondary | ICD-10-CM

## 2017-11-09 DIAGNOSIS — R159 Full incontinence of feces: Secondary | ICD-10-CM | POA: Diagnosis not present

## 2017-11-09 DIAGNOSIS — Z01419 Encounter for gynecological examination (general) (routine) without abnormal findings: Secondary | ICD-10-CM | POA: Diagnosis not present

## 2017-12-22 DIAGNOSIS — E038 Other specified hypothyroidism: Secondary | ICD-10-CM | POA: Diagnosis not present

## 2017-12-22 DIAGNOSIS — M859 Disorder of bone density and structure, unspecified: Secondary | ICD-10-CM | POA: Diagnosis not present

## 2017-12-22 DIAGNOSIS — R82998 Other abnormal findings in urine: Secondary | ICD-10-CM | POA: Diagnosis not present

## 2017-12-22 DIAGNOSIS — E7849 Other hyperlipidemia: Secondary | ICD-10-CM | POA: Diagnosis not present

## 2018-04-14 DIAGNOSIS — M79644 Pain in right finger(s): Secondary | ICD-10-CM | POA: Diagnosis not present

## 2018-04-14 DIAGNOSIS — Z6823 Body mass index (BMI) 23.0-23.9, adult: Secondary | ICD-10-CM | POA: Diagnosis not present

## 2018-06-15 DIAGNOSIS — L814 Other melanin hyperpigmentation: Secondary | ICD-10-CM | POA: Diagnosis not present

## 2018-06-15 DIAGNOSIS — Z85828 Personal history of other malignant neoplasm of skin: Secondary | ICD-10-CM | POA: Diagnosis not present

## 2018-06-15 DIAGNOSIS — L603 Nail dystrophy: Secondary | ICD-10-CM | POA: Diagnosis not present

## 2018-06-15 DIAGNOSIS — L821 Other seborrheic keratosis: Secondary | ICD-10-CM | POA: Diagnosis not present

## 2018-06-15 DIAGNOSIS — D225 Melanocytic nevi of trunk: Secondary | ICD-10-CM | POA: Diagnosis not present

## 2018-06-15 DIAGNOSIS — L570 Actinic keratosis: Secondary | ICD-10-CM | POA: Diagnosis not present

## 2018-06-15 DIAGNOSIS — D1801 Hemangioma of skin and subcutaneous tissue: Secondary | ICD-10-CM | POA: Diagnosis not present

## 2018-06-15 DIAGNOSIS — D2262 Melanocytic nevi of left upper limb, including shoulder: Secondary | ICD-10-CM | POA: Diagnosis not present

## 2018-06-29 ENCOUNTER — Other Ambulatory Visit (HOSPITAL_COMMUNITY): Payer: Self-pay | Admitting: Internal Medicine

## 2018-06-29 ENCOUNTER — Ambulatory Visit (HOSPITAL_COMMUNITY)
Admission: RE | Admit: 2018-06-29 | Discharge: 2018-06-29 | Disposition: A | Discharge status: Home / Self Care | Payer: Medicare Other | Source: Ambulatory Visit | Attending: Family | Admitting: Family

## 2018-06-29 DIAGNOSIS — Z23 Encounter for immunization: Secondary | ICD-10-CM | POA: Diagnosis not present

## 2018-06-29 DIAGNOSIS — R609 Edema, unspecified: Secondary | ICD-10-CM

## 2018-07-19 DIAGNOSIS — D485 Neoplasm of uncertain behavior of skin: Secondary | ICD-10-CM | POA: Diagnosis not present

## 2018-07-19 DIAGNOSIS — C44311 Basal cell carcinoma of skin of nose: Secondary | ICD-10-CM | POA: Diagnosis not present

## 2018-07-19 DIAGNOSIS — Z85828 Personal history of other malignant neoplasm of skin: Secondary | ICD-10-CM | POA: Diagnosis not present

## 2018-08-25 DIAGNOSIS — Z85828 Personal history of other malignant neoplasm of skin: Secondary | ICD-10-CM | POA: Diagnosis not present

## 2018-08-25 DIAGNOSIS — C44311 Basal cell carcinoma of skin of nose: Secondary | ICD-10-CM | POA: Diagnosis not present

## 2018-09-27 ENCOUNTER — Other Ambulatory Visit: Payer: Self-pay | Admitting: Internal Medicine

## 2018-09-27 DIAGNOSIS — Z1231 Encounter for screening mammogram for malignant neoplasm of breast: Secondary | ICD-10-CM

## 2018-10-27 ENCOUNTER — Ambulatory Visit
Admission: RE | Admit: 2018-10-27 | Discharge: 2018-10-27 | Disposition: A | Discharge status: Home / Self Care | Payer: Medicare Other | Source: Ambulatory Visit | Attending: Internal Medicine | Admitting: Internal Medicine

## 2018-10-27 DIAGNOSIS — Z1231 Encounter for screening mammogram for malignant neoplasm of breast: Secondary | ICD-10-CM | POA: Diagnosis not present

## 2018-11-10 DIAGNOSIS — N763 Subacute and chronic vulvitis: Secondary | ICD-10-CM | POA: Diagnosis not present

## 2018-11-28 DIAGNOSIS — H524 Presbyopia: Secondary | ICD-10-CM | POA: Diagnosis not present

## 2018-11-28 DIAGNOSIS — H26493 Other secondary cataract, bilateral: Secondary | ICD-10-CM | POA: Diagnosis not present

## 2018-11-28 DIAGNOSIS — H04123 Dry eye syndrome of bilateral lacrimal glands: Secondary | ICD-10-CM | POA: Diagnosis not present

## 2018-11-28 DIAGNOSIS — Z961 Presence of intraocular lens: Secondary | ICD-10-CM | POA: Diagnosis not present

## 2018-11-28 DIAGNOSIS — H16143 Punctate keratitis, bilateral: Secondary | ICD-10-CM | POA: Diagnosis not present

## 2018-11-28 DIAGNOSIS — H52223 Regular astigmatism, bilateral: Secondary | ICD-10-CM | POA: Diagnosis not present

## 2018-11-28 DIAGNOSIS — H5203 Hypermetropia, bilateral: Secondary | ICD-10-CM | POA: Diagnosis not present

## 2018-12-28 DIAGNOSIS — M859 Disorder of bone density and structure, unspecified: Secondary | ICD-10-CM | POA: Diagnosis not present

## 2018-12-28 DIAGNOSIS — E038 Other specified hypothyroidism: Secondary | ICD-10-CM | POA: Diagnosis not present

## 2018-12-28 DIAGNOSIS — E7849 Other hyperlipidemia: Secondary | ICD-10-CM | POA: Diagnosis not present

## 2019-01-01 DIAGNOSIS — R32 Unspecified urinary incontinence: Secondary | ICD-10-CM | POA: Diagnosis not present

## 2019-01-01 DIAGNOSIS — M1990 Unspecified osteoarthritis, unspecified site: Secondary | ICD-10-CM | POA: Diagnosis not present

## 2019-01-01 DIAGNOSIS — F419 Anxiety disorder, unspecified: Secondary | ICD-10-CM | POA: Diagnosis not present

## 2019-01-01 DIAGNOSIS — E785 Hyperlipidemia, unspecified: Secondary | ICD-10-CM | POA: Diagnosis not present

## 2019-01-01 DIAGNOSIS — E039 Hypothyroidism, unspecified: Secondary | ICD-10-CM | POA: Diagnosis not present

## 2019-01-01 DIAGNOSIS — H04129 Dry eye syndrome of unspecified lacrimal gland: Secondary | ICD-10-CM | POA: Diagnosis not present

## 2019-01-01 DIAGNOSIS — H9190 Unspecified hearing loss, unspecified ear: Secondary | ICD-10-CM | POA: Diagnosis not present

## 2019-01-01 DIAGNOSIS — Z0000 Encounter for general adult medical examination without abnormal findings: Secondary | ICD-10-CM | POA: Diagnosis not present

## 2019-01-01 DIAGNOSIS — Z1331 Encounter for screening for depression: Secondary | ICD-10-CM | POA: Diagnosis not present

## 2019-01-01 DIAGNOSIS — R609 Edema, unspecified: Secondary | ICD-10-CM | POA: Diagnosis not present

## 2019-01-01 DIAGNOSIS — F325 Major depressive disorder, single episode, in full remission: Secondary | ICD-10-CM | POA: Diagnosis not present

## 2019-01-01 DIAGNOSIS — R413 Other amnesia: Secondary | ICD-10-CM | POA: Diagnosis not present

## 2019-06-26 DIAGNOSIS — Z23 Encounter for immunization: Secondary | ICD-10-CM | POA: Diagnosis not present

## 2019-10-02 ENCOUNTER — Other Ambulatory Visit: Payer: Self-pay | Admitting: Internal Medicine

## 2019-10-02 DIAGNOSIS — Z1231 Encounter for screening mammogram for malignant neoplasm of breast: Secondary | ICD-10-CM

## 2019-10-30 DIAGNOSIS — Z85828 Personal history of other malignant neoplasm of skin: Secondary | ICD-10-CM | POA: Diagnosis not present

## 2019-10-30 DIAGNOSIS — L720 Epidermal cyst: Secondary | ICD-10-CM | POA: Diagnosis not present

## 2019-10-30 DIAGNOSIS — D225 Melanocytic nevi of trunk: Secondary | ICD-10-CM | POA: Diagnosis not present

## 2019-10-30 DIAGNOSIS — L814 Other melanin hyperpigmentation: Secondary | ICD-10-CM | POA: Diagnosis not present

## 2019-10-30 DIAGNOSIS — L821 Other seborrheic keratosis: Secondary | ICD-10-CM | POA: Diagnosis not present

## 2019-10-30 DIAGNOSIS — L570 Actinic keratosis: Secondary | ICD-10-CM | POA: Diagnosis not present

## 2019-10-30 DIAGNOSIS — D2271 Melanocytic nevi of right lower limb, including hip: Secondary | ICD-10-CM | POA: Diagnosis not present

## 2019-11-09 ENCOUNTER — Ambulatory Visit
Admission: RE | Admit: 2019-11-09 | Discharge: 2019-11-09 | Disposition: A | Discharge status: Home / Self Care | Payer: Medicare Other | Source: Ambulatory Visit | Attending: Internal Medicine | Admitting: Internal Medicine

## 2019-11-09 ENCOUNTER — Other Ambulatory Visit: Payer: Self-pay

## 2019-11-09 DIAGNOSIS — Z1231 Encounter for screening mammogram for malignant neoplasm of breast: Secondary | ICD-10-CM

## 2019-11-16 DIAGNOSIS — Z01419 Encounter for gynecological examination (general) (routine) without abnormal findings: Secondary | ICD-10-CM | POA: Diagnosis not present

## 2019-11-16 DIAGNOSIS — R159 Full incontinence of feces: Secondary | ICD-10-CM | POA: Diagnosis not present

## 2019-11-16 DIAGNOSIS — N816 Rectocele: Secondary | ICD-10-CM | POA: Diagnosis not present

## 2019-11-16 DIAGNOSIS — R32 Unspecified urinary incontinence: Secondary | ICD-10-CM | POA: Diagnosis not present

## 2019-12-27 DIAGNOSIS — M859 Disorder of bone density and structure, unspecified: Secondary | ICD-10-CM | POA: Diagnosis not present

## 2019-12-27 DIAGNOSIS — E781 Pure hyperglyceridemia: Secondary | ICD-10-CM | POA: Diagnosis not present

## 2019-12-27 DIAGNOSIS — E038 Other specified hypothyroidism: Secondary | ICD-10-CM | POA: Diagnosis not present

## 2019-12-27 DIAGNOSIS — R739 Hyperglycemia, unspecified: Secondary | ICD-10-CM | POA: Diagnosis not present

## 2019-12-31 DIAGNOSIS — R82998 Other abnormal findings in urine: Secondary | ICD-10-CM | POA: Diagnosis not present

## 2020-01-01 DIAGNOSIS — K921 Melena: Secondary | ICD-10-CM | POA: Diagnosis not present

## 2020-01-01 LAB — IFOBT (OCCULT BLOOD): IFOBT: POSITIVE

## 2020-01-17 DIAGNOSIS — K921 Melena: Secondary | ICD-10-CM | POA: Diagnosis not present

## 2020-01-17 LAB — HEMOCCULT SLIDES (X 3 CARDS): OCCULT 1: NEGATIVE

## 2020-01-22 ENCOUNTER — Encounter: Payer: Self-pay | Admitting: Gastroenterology

## 2020-01-30 DIAGNOSIS — Z85828 Personal history of other malignant neoplasm of skin: Secondary | ICD-10-CM | POA: Diagnosis not present

## 2020-01-30 DIAGNOSIS — L570 Actinic keratosis: Secondary | ICD-10-CM | POA: Diagnosis not present

## 2020-03-11 ENCOUNTER — Ambulatory Visit (INDEPENDENT_AMBULATORY_CARE_PROVIDER_SITE_OTHER): Payer: Medicare Other | Admitting: Gastroenterology

## 2020-03-11 ENCOUNTER — Encounter: Payer: Self-pay | Admitting: Gastroenterology

## 2020-03-11 VITALS — BP 100/70 | HR 71 | Wt 117.1 lb

## 2020-03-11 DIAGNOSIS — Z800 Family history of malignant neoplasm of digestive organs: Secondary | ICD-10-CM | POA: Diagnosis not present

## 2020-03-11 DIAGNOSIS — K921 Melena: Secondary | ICD-10-CM

## 2020-03-11 MED ORDER — NA SULFATE-K SULFATE-MG SULF 17.5-3.13-1.6 GM/177ML PO SOLN: 1.0000 | Freq: Once | ORAL | 0 refills | Status: AC

## 2020-03-11 MED ORDER — METOCLOPRAMIDE HCL 10 MG PO TABS: 10.0000 mg | ORAL_TABLET | ORAL | 0 refills | Status: DC

## 2020-03-11 NOTE — Progress Notes (Signed)
History of Present Illness: This is a 79 year old female referred by Shon Baton, MD for the evaluation of Hemosure postive stool and Hemoccult positive stool.  She has long-term difficulties with frequent fecal incontinence and this has not changed.  She has no other gastrointestinal complaints.  Hemosure positive stool was noted on April 20 and was followed by Hemoccult positive stool on May 6.  CBC, CMP were unremarkable on April 15.  Denies weight loss, abdominal pain, constipation, diarrhea, change in stool caliber, melena, hematochezia, nausea, vomiting, dysphagia, reflux symptoms, chest pain.   Colonoscopy 12/2015 - Mild diverticulosis in the sigmoid colon. - The distal rectum and anal verge are normal on retroflexion view.    No Known Allergies Outpatient Medications Prior to Visit  Medication Sig Dispense Refill  . acetaminophen (TYLENOL) 500 MG tablet Take 500 mg by mouth at bedtime.     Marland Kitchen levothyroxine (SYNTHROID, LEVOTHROID) 112 MCG tablet Take 88 mcg by mouth daily before breakfast.     . LORazepam (ATIVAN) 0.5 MG tablet Take 0.5 mg by mouth at bedtime.     . Omega-3 Fatty Acids (FISH OIL) 1200 MG CAPS Take 1 capsule by mouth daily.     Marland Kitchen escitalopram (LEXAPRO) 5 MG tablet Take 5 mg by mouth daily.    Vladimir Faster Glycol-Propyl Glycol (SYSTANE) 0.4-0.3 % SOLN Apply 1 drop to eye 4 (four) times daily.     No facility-administered medications prior to visit.   Past Medical History:  Diagnosis Date  . Allergic rhinitis   . Anxiety   . Arthritis   . Asthma   . Bladder problem    bladder leakage at times  . Cataract    beginning of one  . Depression   . Hypothyroidism   . Insomnia   . Osteopenia   . Thyroid disease    Past Surgical History:  Procedure Laterality Date  . CATARACT EXTRACTION W/ INTRAOCULAR LENS  IMPLANT, BILATERAL  2016  . COLONOSCOPY    . WISDOM TOOTH EXTRACTION  1960   Social History   Socioeconomic History  . Marital status: Married     Spouse name: Not on file  . Number of children: Not on file  . Years of education: Not on file  . Highest education level: Not on file  Occupational History  . Not on file  Tobacco Use  . Smoking status: Never Smoker  . Smokeless tobacco: Never Used  Substance and Sexual Activity  . Alcohol use: No    Alcohol/week: 0.0 standard drinks  . Drug use: No  . Sexual activity: Not on file  Other Topics Concern  . Not on file  Social History Narrative  . Not on file   Social Determinants of Health   Financial Resource Strain:   . Difficulty of Paying Living Expenses:   Food Insecurity:   . Worried About Charity fundraiser in the Last Year:   . Arboriculturist in the Last Year:   Transportation Needs:   . Film/video editor (Medical):   Marland Kitchen Lack of Transportation (Non-Medical):   Physical Activity:   . Days of Exercise per Week:   . Minutes of Exercise per Session:   Stress:   . Feeling of Stress :   Social Connections:   . Frequency of Communication with Friends and Family:   . Frequency of Social Gatherings with Friends and Family:   . Attends Religious Services:   . Active Member of Clubs  or Organizations:   . Attends Archivist Meetings:   Marland Kitchen Marital Status:    Family History  Problem Relation Age of Onset  . Colon cancer Brother 36  . Esophageal cancer Maternal Uncle   . Lung cancer Brother   . Cancer Mother   . Stroke Mother   . Breast cancer Mother   . Heart disease Father   . Breast cancer Maternal Aunt   . Breast cancer Maternal Aunt       Review of Systems: Pertinent positive and negative review of systems were noted in the above HPI section. All other review of systems were otherwise negative.    Physical Exam: General: Well developed, well nourished, no acute distress Head: Normocephalic and atraumatic Eyes:  sclerae anicteric, EOMI Ears: Normal auditory acuity Mouth: Not examined, mask on during Covid-19 pandemic Neck: Supple, no  masses or thyromegaly Lungs: Clear throughout to auscultation Heart: Regular rate and rhythm; no murmurs, rubs or bruits Abdomen: Soft, non tender and non distended. No masses, hepatosplenomegaly or hernias noted. Normal Bowel sounds Rectal: Deferred to colonoscopy Musculoskeletal: Symmetrical with no gross deformities  Skin: No lesions on visible extremities Pulses:  Normal pulses noted Extremities: No clubbing, cyanosis, edema or deformities noted Neurological: Alert oriented x 4, grossly nonfocal Cervical Nodes:  No significant cervical adenopathy Inguinal Nodes: No significant inguinal adenopathy Psychological:  Alert and cooperative. Normal mood and affect   Assessment and Recommendations:  1.  Occult blood in stool.  Family history of colon cancer, first-degree relative, brother at age 65.  Rule out colorectal neoplasms, hemorrhoids, AVMs and other disorders.  Schedule colonoscopy. The risks (including bleeding, perforation, infection, missed lesions, medication reactions and possible hospitalization or surgery if complications occur), benefits, and alternatives to colonoscopy with possible biopsy and possible polypectomy were discussed with the patient and they consent to proceed.    cc: Shon Baton, MD [address] [place],  [place] [address]

## 2020-03-11 NOTE — Patient Instructions (Signed)
We have sent the following medications to your pharmacy for you to pick up at your convenience: [facility].   You have been scheduled for a colonoscopy. Please follow written instructions given to you at your visit today.  Please pick up your prep supplies at the pharmacy within the next 1-3 days. If you use inhalers (even only as needed), please bring them with you on the day of your procedure.  Thank you for choosing me and [facility].  Pricilla Riffle. Dagoberto Ligas., MD., [facility] Regal

## 2020-03-24 ENCOUNTER — Other Ambulatory Visit: Payer: Self-pay

## 2020-03-24 ENCOUNTER — Ambulatory Visit (AMBULATORY_SURGERY_CENTER): Payer: Medicare Other | Admitting: Gastroenterology

## 2020-03-24 ENCOUNTER — Encounter: Payer: Self-pay | Admitting: Gastroenterology

## 2020-03-24 VITALS — BP 114/62 | HR 58 | Temp 97.1°F | Resp 17 | Ht 60.0 in | Wt 117.0 lb

## 2020-03-24 DIAGNOSIS — Z800 Family history of malignant neoplasm of digestive organs: Secondary | ICD-10-CM

## 2020-03-24 DIAGNOSIS — R195 Other fecal abnormalities: Secondary | ICD-10-CM | POA: Diagnosis present

## 2020-03-24 DIAGNOSIS — K5730 Diverticulosis of large intestine without perforation or abscess without bleeding: Secondary | ICD-10-CM

## 2020-03-24 DIAGNOSIS — K640 First degree hemorrhoids: Secondary | ICD-10-CM

## 2020-03-24 MED ORDER — SODIUM CHLORIDE 0.9 % IV SOLN: 500.0000 mL | Freq: Once | INTRAVENOUS | Status: DC

## 2020-03-24 NOTE — Progress Notes (Signed)
Pt's states no medical or surgical changes since previsit or office visit.  CW - vitals 

## 2020-03-24 NOTE — Op Note (Signed)
[facility] Patient Name: Arda Daggs Procedure Date: 03/24/2020 8:54 AM MRN: [id number] Endoscopist: Ladene Artist , MD Age: 79 Referring MD:  Date of Birth: Jan 05, 1941 Gender: Female Account #: [id number] Procedure:                Colonoscopy Indications:              Heme positive stool, Positive fecal immunochemical                            test Medicines:                Monitored Anesthesia Care Procedure:                Pre-Anesthesia Assessment:                           - Prior to the procedure, a History and Physical                            was performed, and patient medications and                            allergies were reviewed. The patient's tolerance of                            previous anesthesia was also reviewed. The risks                            and benefits of the procedure and the sedation                            options and risks were discussed with the patient.                            All questions were answered, and informed consent                            was obtained. Prior Anticoagulants: The patient has                            taken no previous anticoagulant or antiplatelet                            agents. ASA Grade Assessment: II - A patient with                            mild systemic disease. After reviewing the risks                            and benefits, the patient was deemed in                            satisfactory condition to undergo the procedure.  After obtaining informed consent, the colonoscope                            was passed under direct vision. Throughout the                            procedure, the patient's blood pressure, pulse, and                            oxygen saturations were monitored continuously. The                            Colonoscope was introduced through the anus and                            advanced to the the cecum, identified by                             appendiceal orifice and ileocecal valve. The                            ileocecal valve, appendiceal orifice, and rectum                            were photographed. The quality of the bowel                            preparation was excellent. The colonoscopy was                            performed without difficulty. The patient tolerated                            the procedure well. Scope In: 8:57:36 AM Scope Out: 9:11:28 AM Scope Withdrawal Time: 0 hours 10 minutes 40 seconds  Total Procedure Duration: 0 hours 13 minutes 52 seconds  Findings:                 The perianal and digital rectal examinations were                            normal.                           The terminal ileum appeared normal.                           Multiple medium-mouthed diverticula were found in                            the left colon. There was no evidence of                            diverticular bleeding.  A single medium-mouthed diverticulum was found in                            the cecum.                           Internal hemorrhoids were found during                            retroflexion. The hemorrhoids were small and Grade                            I (internal hemorrhoids that do not prolapse).                           The exam was otherwise without abnormality on                            direct and retroflexion views. Complications:            No immediate complications. Estimated blood loss:                            None. Estimated Blood Loss:     Estimated blood loss: none. Impression:               - Normal terminal ileum.                           - Mild diverticulosis in the left colon.                           - Single cecal diverticulum.                           - Internal hemorrhoids.                           - The examination was otherwise normal on direct                            and retroflexion views.                            - No specimens collected. Recommendation:           - Patient has a contact number available for                            emergencies. The signs and symptoms of potential                            delayed complications were discussed with the                            patient. Return to normal activities tomorrow.  Written discharge instructions were provided to the                            patient.                           - Resume previous diet.                           - Continue present medications.                           - Internal hemorrhoids are the likely source for                            occult blood in stool.                           - No repeat colonoscopy due to age and the absence                            of colonic polyps. Ladene Artist, MD 03/24/2020 9:16:25 AM This report has been signed electronically.

## 2020-03-24 NOTE — Patient Instructions (Signed)
Read all of the handouts given to you by your recovery room nurse.  Thank-you for choosing [place] for your healthcare needs today.  YOU HAD AN ENDOSCOPIC PROCEDURE TODAY AT [facility]:   Refer to the procedure report that was given to you for any specific questions about what was found during the examination.  If the procedure report does not answer your questions, please call your gastroenterologist to clarify.  If you requested that your care partner not be given the details of your procedure findings, then the procedure report has been included in a sealed envelope for you to review at your convenience later.  YOU SHOULD EXPECT: Some feelings of bloating in the abdomen. Passage of more gas than usual.  Walking can help get rid of the air that was put into your GI tract during the procedure and reduce the bloating. If you had a lower endoscopy (such as a colonoscopy or flexible sigmoidoscopy) you may notice spotting of blood in your stool or on the toilet paper. If you underwent a bowel prep for your procedure, you may not have a normal bowel movement for a few days.  Please Note:  You might notice some irritation and congestion in your nose or some drainage.  This is from the oxygen used during your procedure.  There is no need for concern and it should clear up in a day or so.  SYMPTOMS TO REPORT IMMEDIATELY:   Following lower endoscopy (colonoscopy or flexible sigmoidoscopy):  Excessive amounts of blood in the stool  Significant tenderness or worsening of abdominal pains  Swelling of the abdomen that is new, acute  Fever of 100F or higher  For urgent or emergent issues, a gastroenterologist can be reached at any hour by calling [phone]. Do not use MyChart messaging for urgent concerns.    DIET:  We do recommend a small meal at first, but then you may proceed to your regular diet.  Drink plenty of fluids but you should avoid alcoholic beverages for 24 hours. Try to eat  a high fiber diet, and drink plenty of the water.  ACTIVITY:  You should plan to take it easy for the rest of today and you should NOT DRIVE or use heavy machinery until tomorrow (because of the sedation medicines used during the test).    FOLLOW UP: Our staff will call the number listed on your records 48-72 hours following your procedure to check on you and address any questions or concerns that you may have regarding the information given to you following your procedure. If we do not reach you, we will leave a message.  We will attempt to reach you two times.  During this call, we will ask if you have developed any symptoms of COVID 19. If you develop any symptoms (ie: fever, flu-like symptoms, shortness of breath, cough etc.) before then, please call [phone].  If you test positive for Covid 19 in the 2 weeks post procedure, please call and report this information to [place].    If any biopsies were taken you will be contacted by phone or by letter within the next 1-3 weeks.  Please call us at [phone] if you have not heard about the biopsies in 3 weeks.    SIGNATURES/CONFIDENTIALITY: You and/or your care partner have signed paperwork which will be entered into your electronic medical record.  These signatures attest to the fact that that the information above on your After Visit Summary has been reviewed and  is understood.  Full responsibility of the confidentiality of this discharge information lies with you and/or your care-partner.

## 2020-03-24 NOTE — Progress Notes (Signed)
Report given to PACU, vss 

## 2020-03-26 ENCOUNTER — Telehealth: Payer: Self-pay | Admitting: *Deleted

## 2020-03-26 NOTE — Telephone Encounter (Signed)
  Follow up Call-  Call back number 03/24/2020  Post procedure Call Back phone  # [phone]  or [phone]  Permission to leave phone message Yes  Some recent data might be hidden     Patient questions:  Do you have a fever, pain , or abdominal swelling? No. Pain Score  0 *  Have you tolerated food without any problems? Yes.    Have you been able to return to your normal activities? Yes.    Do you have any questions about your discharge instructions: Diet   No. Medications  No. Follow up visit  No.  Do you have questions or concerns about your Care? No.  Actions: * If pain score is 4 or above: No action needed, pain <4.  1. Have you developed a fever since your procedure? no  2.   Have you had an respiratory symptoms (SOB or cough) since your procedure? no  3.   Have you tested positive for COVID 19 since your procedure no  4.   Have you had any family members/close contacts diagnosed with the COVID 19 since your procedure?  no   If yes to any of these questions please route to Joylene John, RN and Erenest Rasher, RN

## 2020-06-16 DIAGNOSIS — Z23 Encounter for immunization: Secondary | ICD-10-CM | POA: Diagnosis not present

## 2020-07-06 DIAGNOSIS — Z23 Encounter for immunization: Secondary | ICD-10-CM | POA: Diagnosis not present

## 2020-07-22 ENCOUNTER — Ambulatory Visit (INDEPENDENT_AMBULATORY_CARE_PROVIDER_SITE_OTHER): Payer: Medicare Other | Admitting: Podiatry

## 2020-07-22 ENCOUNTER — Other Ambulatory Visit: Payer: Self-pay

## 2020-07-22 ENCOUNTER — Ambulatory Visit (INDEPENDENT_AMBULATORY_CARE_PROVIDER_SITE_OTHER): Payer: Medicare Other

## 2020-07-22 ENCOUNTER — Encounter: Payer: Self-pay | Admitting: Podiatry

## 2020-07-22 DIAGNOSIS — N941 Unspecified dyspareunia: Secondary | ICD-10-CM | POA: Insufficient documentation

## 2020-07-22 DIAGNOSIS — M778 Other enthesopathies, not elsewhere classified: Secondary | ICD-10-CM | POA: Diagnosis not present

## 2020-07-22 DIAGNOSIS — D649 Anemia, unspecified: Secondary | ICD-10-CM | POA: Insufficient documentation

## 2020-07-22 DIAGNOSIS — M2042 Other hammer toe(s) (acquired), left foot: Secondary | ICD-10-CM

## 2020-07-22 DIAGNOSIS — N3941 Urge incontinence: Secondary | ICD-10-CM | POA: Insufficient documentation

## 2020-07-22 DIAGNOSIS — N816 Rectocele: Secondary | ICD-10-CM | POA: Insufficient documentation

## 2020-07-22 NOTE — Progress Notes (Signed)
Subjective:  Patient ID: Tammy Riley, female    DOB: [birth year],  MRN: [id number] HPI Chief Complaint  Patient presents with  . Foot Pain    Dorsal midfoot right - sensitivity x 2-3 months, no injury, doesn't swell, thought she might have shoes tied too tight, worse at night  . Toe Pain    Toes left - concerned about toes being crooked  . New Patient (Initial Visit)    79 y.o. female presents with the above complaint.   ROS: Denies fever chills nausea vomiting muscle aches pains calf pain back pain chest pain shortness of breath.  Past Medical History:  Diagnosis Date  . Allergic rhinitis   . Anxiety   . Arthritis   . Asthma   . Bladder problem    bladder leakage at times  . Cataract    beginning of one  . Depression   . Hypothyroidism   . Insomnia   . Osteopenia   . Thyroid disease    Past Surgical History:  Procedure Laterality Date  . CATARACT EXTRACTION W/ INTRAOCULAR LENS  IMPLANT, BILATERAL  2016  . COLONOSCOPY    . WISDOM TOOTH EXTRACTION  1960    Current Outpatient Medications:  .  Cholecalciferol (VITAMIN D3 PO), Take by mouth., Disp: , Rfl:  .  levothyroxine (SYNTHROID) 88 MCG tablet, Take 88 mcg by mouth daily before breakfast., Disp: , Rfl:  .  Propylene Glycol (SYSTANE BALANCE OP), Apply to eye., Disp: , Rfl:  .  acetaminophen (TYLENOL) 500 MG tablet, Take 500 mg by mouth at bedtime. , Disp: , Rfl:  .  LORazepam (ATIVAN) 0.5 MG tablet, Take 0.5 mg by mouth at bedtime. , Disp: , Rfl:  .  Omega-3 Fatty Acids (FISH OIL) 1200 MG CAPS, Take 1 capsule by mouth daily. , Disp: , Rfl:   No Known Allergies Review of Systems Objective:  There were no vitals filed for this visit.  General: Well developed, nourished, in no acute distress, alert and oriented x3   Dermatological: Skin is warm, dry and supple bilateral. Nails x 10 are well maintained; remaining integument appears unremarkable at this time. There are no open sores, no preulcerative lesions,  no rash or signs of infection present.  Vascular: Dorsalis Pedis artery and Posterior Tibial artery pedal pulses are 2/4 bilateral with immedate capillary fill time. Pedal hair growth present. No varicosities and no lower extremity edema present bilateral.   Neruologic: Grossly intact via light touch bilateral. Vibratory intact via tuning fork bilateral. Protective threshold with Semmes Wienstein monofilament intact to all pedal sites bilateral. Patellar and Achilles deep tendon reflexes 2+ bilateral. No Babinski or clonus noted bilateral.   Musculoskeletal: No gross boney pedal deformities bilateral. No pain, crepitus, or limitation noted with foot and ankle range of motion bilateral. Muscular strength 5/5 in all groups tested bilateral.  Fat pad atrophy resulting and prominent metatarsal heads.  Mild pain no significant abnormality seen.  Mild flexible hammertoe deformities noted left greater than right.  Hallux valgus deformities are noted bilateral.  Also demonstrates pain on palpation of the second TMT joint and frontal plane range of motion at that same level.  Gait: Unassisted, Nonantalgic.    Radiographs:  Radiographs taken of bilateral osseously mature foot demonstrates no significant acute findings.  Biggest finding is hallux valgus deformities bilateral right greater than left as well as osteoarthritic changes at the second metatarsal middle cuneiform joints right greater than left.   Assessment & Plan:   Assessment: Osteoarthritis  dorsal aspect of the bilateral foot right greater than left hallux valgus bilateral.  Neuritis deep peroneal nerve right.  Metatarsalgia with fat pad atrophy.  Plan: Discussed appropriate shoe gear stretching exercise ice therapy shoe gear modifications dispensed metatarsal silicone pads.     Gaetan Spieker T. [place], [place]

## 2020-07-30 DIAGNOSIS — Z85828 Personal history of other malignant neoplasm of skin: Secondary | ICD-10-CM | POA: Diagnosis not present

## 2020-07-30 DIAGNOSIS — L720 Epidermal cyst: Secondary | ICD-10-CM | POA: Diagnosis not present

## 2020-08-25 DIAGNOSIS — H04122 Dry eye syndrome of left lacrimal gland: Secondary | ICD-10-CM | POA: Diagnosis not present

## 2020-08-25 DIAGNOSIS — D23122 Other benign neoplasm of skin of left lower eyelid, including canthus: Secondary | ICD-10-CM | POA: Diagnosis not present

## 2020-10-16 ENCOUNTER — Other Ambulatory Visit: Payer: Self-pay | Admitting: Internal Medicine

## 2020-10-16 DIAGNOSIS — Z1231 Encounter for screening mammogram for malignant neoplasm of breast: Secondary | ICD-10-CM

## 2020-11-11 ENCOUNTER — Ambulatory Visit
Admission: RE | Admit: 2020-11-11 | Discharge: 2020-11-11 | Disposition: A | Discharge status: Home / Self Care | Payer: Medicare Other | Source: Ambulatory Visit | Attending: Internal Medicine | Admitting: Internal Medicine

## 2020-11-11 ENCOUNTER — Other Ambulatory Visit: Payer: Self-pay

## 2020-11-11 DIAGNOSIS — Z1231 Encounter for screening mammogram for malignant neoplasm of breast: Secondary | ICD-10-CM

## 2021-10-19 ENCOUNTER — Other Ambulatory Visit: Payer: Self-pay | Admitting: Internal Medicine

## 2021-10-19 DIAGNOSIS — Z1231 Encounter for screening mammogram for malignant neoplasm of breast: Secondary | ICD-10-CM

## 2021-11-12 ENCOUNTER — Other Ambulatory Visit: Payer: Self-pay | Admitting: Obstetrics and Gynecology

## 2021-11-12 ENCOUNTER — Ambulatory Visit
Admission: RE | Admit: 2021-11-12 | Discharge: 2021-11-12 | Disposition: A | Discharge status: Home / Self Care | Payer: Medicare Other | Source: Ambulatory Visit | Attending: Internal Medicine | Admitting: Internal Medicine

## 2021-11-12 DIAGNOSIS — Z1231 Encounter for screening mammogram for malignant neoplasm of breast: Secondary | ICD-10-CM

## 2022-10-08 ENCOUNTER — Other Ambulatory Visit: Payer: Self-pay | Admitting: Obstetrics and Gynecology

## 2022-10-08 DIAGNOSIS — Z1231 Encounter for screening mammogram for malignant neoplasm of breast: Secondary | ICD-10-CM

## 2022-11-26 ENCOUNTER — Ambulatory Visit: Payer: Medicare Other

## 2022-12-30 ENCOUNTER — Encounter: Payer: Self-pay | Admitting: Physical Therapy

## 2022-12-30 ENCOUNTER — Other Ambulatory Visit: Payer: Self-pay

## 2022-12-30 ENCOUNTER — Ambulatory Visit: Payer: Medicare Other | Attending: Obstetrics and Gynecology | Admitting: Physical Therapy

## 2022-12-30 DIAGNOSIS — M6281 Muscle weakness (generalized): Secondary | ICD-10-CM | POA: Diagnosis not present

## 2022-12-30 DIAGNOSIS — R293 Abnormal posture: Secondary | ICD-10-CM

## 2022-12-30 DIAGNOSIS — M62838 Other muscle spasm: Secondary | ICD-10-CM

## 2022-12-30 DIAGNOSIS — R279 Unspecified lack of coordination: Secondary | ICD-10-CM | POA: Diagnosis present

## 2022-12-30 NOTE — Patient Instructions (Signed)

## 2022-12-30 NOTE — Therapy (Signed)
OUTPATIENT PHYSICAL THERAPY FEMALE PELVIC EVALUATION   Patient Name: Tammy Riley MRN: [id number] DOB:10/19/1940, 82 y.o., female Today's Date: 12/30/2022  END OF SESSION:  PT End of Session - 12/30/22 0924     Visit Number 1    Date for PT Re-Evaluation 03/31/23    Authorization Type [employer] MEDICARE    Authorization - Visit Number 10    PT Start Time 0930    PT Stop Time 1008    PT Time Calculation (min) 38 min    Activity Tolerance Patient tolerated treatment well    Behavior During Therapy WFL for tasks assessed/performed             Past Medical History:  Diagnosis Date   Allergic rhinitis    Anxiety    Arthritis    Asthma    Bladder problem    bladder leakage at times   Cataract    beginning of one   Depression    Hypothyroidism    Insomnia    Osteopenia    Thyroid disease    Past Surgical History:  Procedure Laterality Date   CATARACT EXTRACTION W/ INTRAOCULAR LENS  IMPLANT, BILATERAL  2016   COLONOSCOPY     WISDOM TOOTH EXTRACTION  1960   Patient Active Problem List   Diagnosis Date Noted   Dyspareunia in female 07/22/2020   Anemia 07/22/2020   Herniation of rectum into vagina 07/22/2020   Urge incontinence of urine 07/22/2020   Trigger ring finger of right hand 02/23/2016   Cervical spondylosis without myelopathy 12/17/2015   Madelung's deformity 12/17/2015   Trigger finger of right thumb 12/17/2015   Chest pain 05/13/2014    PCP: Creola Corn, MD  REFERRING PROVIDER: Ranae Pila, MD  REFERRING DIAG: N39.41 (ICD-10-CM) - Urge incontinence  THERAPY DIAG:  Muscle weakness (generalized)  Abnormal posture  Unspecified lack of coordination  Other muscle spasm  Rationale for Evaluation and Treatment: Rehabilitation  ONSET DATE: 68 years ago   SUBJECTIVE:                                                                                                                                                                                            SUBJECTIVE STATEMENT: Pt reports her gynecologist retired and started seeing a new one, has had leakage of urine and stool. Reports having some incontinence and frequency since having child 68 years ago.  Pt reports she also had loss of bowels with urge and unable to make it in time, more formed stools with this. Does have hemorrhoids that bother her as she wipes several times to get clean.  Had a UTI after pelvic with gyn now cleared  Also taking lorazepam and trazadone   Fluid intake: Yes: water - 2-3 8oz glasses coffee - 4x 1/2 cups daily    PAIN:  Are you having pain? No   PRECAUTIONS: None  WEIGHT BEARING RESTRICTIONS: No  FALLS:  Has patient fallen in last 6 months? No  LIVING ENVIRONMENT: Lives with: lives with their family Lives in: House/apartment   OCCUPATION: retired  PLOF: Independent  PATIENT GOALS: to have less leakage  PERTINENT HISTORY:  fecal incontinence, rectocele,   Sexual abuse: No  BOWEL MOVEMENT: Pain with bowel movement: No Type of bowel movement:Type (Bristol Stool Scale) 4-5, Frequency 3x per morning, and Strain No - but does rock side to side to empty Fully empty rectum: No Leakage: Yes: when can't get there quickly enough Pads: Yes: large pads - changes several times for stool or urine Fiber supplement: No  URINATION: Pain with urination: No Fully empty bladder: Yes:   Stream: Weak Urgency: Yes:   Frequency: sometimes every 45 mins - usually small amounts - gets up several times at night Leakage: Urge to void, Walking to the bathroom, and sometimes unaware, and sometimes at night and unknown Pads: Yes: large pads - changes several times for stool or urine  INTERCOURSE: Pain with intercourse:  not active - due to pain Ability to have vaginal penetration:  No - due to dryness  Climax: not active Marinoff Scale: 3/3  PREGNANCY: Vaginal deliveries 2 Tearing Yes: large babies but unsure grades C-section deliveries  0 Currently pregnant No  PROLAPSE: Rectocele per chart - denies symptoms   OBJECTIVE:   DIAGNOSTIC FINDINGS:    COGNITION: Overall cognitive status: Within functional limits for tasks assessed     SENSATION: Light touch: Appears intact Proprioception: Appears intact  MUSCLE LENGTH: Bil hamstrings and adductor WFL    POSTURE: rounded shoulders and posterior pelvic tilt  PELVIC ALIGNMENT: WFL  LUMBARAROM/PROM:  A/PROM A/PROM  eval  Flexion WFL  Extension WFL  Right lateral flexion WFL  Left lateral flexion WFL  Right rotation WFL  Left rotation WFL   (Blank rows = not tested)  LOWER EXTREMITY ROM:  WFL  LOWER EXTREMITY MMT:  Bil hip abduction 3/5, adduction,ext,flexion 4+/5; knees 5/5 PALPATION:   General  no TTP at glutes, abdomen, hips, pelvis                External Perineal Exam - consistent with decreased estrogen -  atrophy of vulval tissues                              Internal Pelvic Floor consistent with decreased estrogen - atrophy of vagina and constricted introitus   Patient confirms identification and approves PT to assess internal pelvic floor and treatment Yes  PELVIC MMT:   MMT eval  Vaginal 3/5, 6s, 4 reps  Internal Anal Sphincter   External Anal Sphincter   Puborectalis   Diastasis Recti   (Blank rows = not tested)        TONE: WFL  PROLAPSE: Possible grade 1 rectocele noted in hooklying with strong cough  TODAY'S TREATMENT:  DATE:   12/30/2022 EVAL Examination completed, findings reviewed, pt educated on POC, HEP, and bladder irritants and urge drill. Pt motivated to participate in PT and agreeable to attempt recommendations.     PATIENT EDUCATION:  Education details: bladder irritants and urge drill.[id number] Person educated: Patient Education method: Explanation, Demonstration, Tactile cues, Verbal  cues, and Handouts Education comprehension: verbalized understanding and returned demonstration  HOME EXERCISE PROGRAM: [id number]  ASSESSMENT:  CLINICAL IMPRESSION: Patient is a 82 y.o. female  who was seen today for physical therapy evaluation and treatment for incontinence of urine and stool. Pt reports she has had over 60 years of urinary incontinence and increased frequency, since having children. Pt reports she now has more leakage amounts and wears a large pad during the day, every day and a pad at night. Pt also has fecal leakage with urge and very short warning before needing to go to bathroom, then leaks en route. Pt found to have decreased hip and core strength and impaired posture minimally. Patient consented to internal pelvic floor assessment vaginally this date and found to have decreased strength, endurance, and coordination. Patient benefited from verbal cues for improved technique with pelvic floor contractions and to attempt to coordination with breath however pt unable. With focus on just diaphragmatic breathing pt able to complete, and able to complete proper pelvic floor contraction with just focusing on this however unable to coordinate the two as of now. Pt also reports pain with vaginal penetration and unable to have intercourse due to this, mostly due to dryness and now reports "I feel too tight to even try". Pt reports she would like to focus on urine and fecal leakage currently then agreeable to discuss pain with penetration. Pt would benefit from additional PT to further address deficits.    OBJECTIVE IMPAIRMENTS: decreased coordination, decreased endurance, decreased strength, increased fascial restrictions, increased muscle spasms, impaired vision/preception, improper body mechanics, and pain.   ACTIVITY LIMITATIONS: continence and locomotion level  PARTICIPATION LIMITATIONS: interpersonal relationship, driving, shopping, community activity, and yard work  PERSONAL  FACTORS: Time since onset of injury/illness/exacerbation and 1 comorbidity: medical history   are also affecting patient's functional outcome.   REHAB POTENTIAL: Good  CLINICAL DECISION MAKING: Stable/uncomplicated  EVALUATION COMPLEXITY: Low   GOALS: Goals reviewed with patient? Yes  SHORT TERM GOALS: Target date: 01/27/23  Pt to be I with HEP.  Baseline: Goal status: INITIAL  2.  Pt to report improved time between bladder voids to at least 1 hours for improved QOL with decreased urinary frequency.   Baseline:  Goal status: INITIAL  3.  Pt will have 25% less urgency due to bladder retraining and strengthening  Baseline:  Goal status: INITIAL  4.  Pt to demonstrate at least 3/5 pelvic floor strength and ability to complete isometric for at least 8s for improved pelvic stability and decreased strain at pelvic floor/ decrease leakage.  Baseline:  Goal status: INITIAL  5.  Pt will report her BMs are complete due to improved bowel habits and evacuation techniques.  Baseline:  Goal status: INITIAL    LONG TERM GOALS: Target date: 03/31/23  Pt to be I with advanced HEP.  Baseline:  Goal status: INITIAL  2.  Pt to demonstrate at least 5/5 bil hip strength for improved pelvic stability and functional squats without leakage.  Baseline:  Goal status: INITIAL  3.  Pt to report improved time between bladder voids to at least 1.5 hours for improved QOL with decreased urinary frequency.  Baseline:  Goal status: INITIAL  4.  Pt to demonstrate improved coordination of pelvic floor and breathing mechanics with body weight squat with appropriate synergistic patterns to decrease pain and leakage at least 75% of the time.    Baseline:  Goal status: INITIAL  5.  Pt to demonstrate at least 4/5 pelvic floor strength for improved pelvic stability and decreased strain at pelvic floor/ decrease leakage.  Baseline:  Goal status: INITIAL  6.  Pt will have 50% less urgency due to bladder  retraining and strengthening  Baseline:  Goal status: INITIAL  7.  Pt to report improved leakage symptoms with no urine or fecal leakage for at least one week for improved QOL.  Baseline:  Goal status: INITIAL  PLAN:  PT FREQUENCY: 1x/week  PT DURATION:  8 sessions  PLANNED INTERVENTIONS: Therapeutic exercises, Therapeutic activity, Neuromuscular re-education, Balance training, Gait training, Patient/Family education, Self Care, Spinal mobilization, Cryotherapy, Moist heat, scar mobilization, Taping, Biofeedback, and Manual therapy  PLAN FOR NEXT SESSION: bladder retraining/diary, review urge drill, breathing mechanics, coordination of breathing/core/pelvic floor, core and hip strengthening, pelvic floor strengthening, bowel retraining, voiding mechanics  Otelia Sergeant, PT, DPT 12/29/2408:48 AM

## 2023-01-06 ENCOUNTER — Ambulatory Visit: Payer: Medicare Other | Admitting: Physical Therapy

## 2023-01-13 ENCOUNTER — Ambulatory Visit: Payer: Medicare Other | Attending: Obstetrics and Gynecology | Admitting: Physical Therapy

## 2023-01-13 DIAGNOSIS — M6281 Muscle weakness (generalized): Secondary | ICD-10-CM | POA: Diagnosis present

## 2023-01-13 DIAGNOSIS — R293 Abnormal posture: Secondary | ICD-10-CM

## 2023-01-13 DIAGNOSIS — R279 Unspecified lack of coordination: Secondary | ICD-10-CM

## 2023-01-13 NOTE — Therapy (Signed)
OUTPATIENT PHYSICAL THERAPY FEMALE PELVIC EVALUATION   Patient Name: Tammy Riley MRN: [id number] DOB:[birth year], 82 y.o., female Today's Date: 01/13/2023  END OF SESSION:  PT End of Session - 01/13/23 1050     Visit Number 2    Date for PT Re-Evaluation 03/31/23    Authorization Type [employer] MEDICARE    Authorization - Visit Number 10    PT Start Time 1100    PT Stop Time 1142    PT Time Calculation (min) 42 min    Activity Tolerance Patient tolerated treatment well    Behavior During Therapy WFL for tasks assessed/performed             Past Medical History:  Diagnosis Date   Allergic rhinitis    Anxiety    Arthritis    Asthma    Bladder problem    bladder leakage at times   Cataract    beginning of one   Depression    Hypothyroidism    Insomnia    Osteopenia    Thyroid disease    Past Surgical History:  Procedure Laterality Date   CATARACT EXTRACTION W/ INTRAOCULAR LENS  IMPLANT, BILATERAL  2016   COLONOSCOPY     WISDOM TOOTH EXTRACTION  1960   Patient Active Problem List   Diagnosis Date Noted   Dyspareunia in female 07/22/2020   Anemia 07/22/2020   Herniation of rectum into vagina 07/22/2020   Urge incontinence of urine 07/22/2020   Trigger ring finger of right hand 02/23/2016   Cervical spondylosis without myelopathy 12/17/2015   Madelung's deformity 12/17/2015   Trigger finger of right thumb 12/17/2015   Chest pain 05/13/2014    PCP: Creola Corn, MD  REFERRING PROVIDER: Ranae Pila, MD  REFERRING DIAG: N39.41 (ICD-10-CM) - Urge incontinence  THERAPY DIAG:  Muscle weakness (generalized)  Unspecified lack of coordination  Abnormal posture  Rationale for Evaluation and Treatment: Rehabilitation  ONSET DATE: 68 years ago   SUBJECTIVE:                                                                                                                                                                                            SUBJECTIVE STATEMENT: Pt reports she is not getting up as much as night now. Didn't really notice a difference with urge drill yet.   Fluid intake: Yes: water - 2-3 8oz glasses coffee - 4x 1/2 cups daily    PAIN:  Are you having pain? No   PRECAUTIONS: None  WEIGHT BEARING RESTRICTIONS: No  FALLS:  Has patient fallen in last 6 months? No  LIVING ENVIRONMENT: Lives with: lives  with their family Lives in: House/apartment   OCCUPATION: retired  PLOF: Independent  PATIENT GOALS: to have less leakage  PERTINENT HISTORY:  fecal incontinence, rectocele,   Sexual abuse: No  BOWEL MOVEMENT: Pain with bowel movement: No Type of bowel movement:Type (Bristol Stool Scale) 4-5, Frequency 3x per morning, and Strain No - but does rock side to side to empty Fully empty rectum: No Leakage: Yes: when can't get there quickly enough and with passing gas Pads: Yes: large pads - changes several times for stool or urine Fiber supplement: No  URINATION: Pain with urination: No Fully empty bladder: Yes:   Stream: Weak Urgency: Yes:   Frequency: sometimes every 45 mins - usually small amounts - gets up several times at night Leakage: Urge to void, Walking to the bathroom, and sometimes unaware, and sometimes at night and unknown Pads: Yes: large pads - changes several times for stool or urine  INTERCOURSE: Pain with intercourse:  not active - due to pain Ability to have vaginal penetration:  No - due to dryness  Climax: not active Marinoff Scale: 3/3  PREGNANCY: Vaginal deliveries 2 Tearing Yes: large babies but unsure grades C-section deliveries 0 Currently pregnant No  PROLAPSE: Rectocele per chart - denies symptoms   OBJECTIVE:   DIAGNOSTIC FINDINGS:    COGNITION: Overall cognitive status: Within functional limits for tasks assessed     SENSATION: Light touch: Appears intact Proprioception: Appears intact  MUSCLE LENGTH: Bil hamstrings and adductor  WFL    POSTURE: rounded shoulders and posterior pelvic tilt  PELVIC ALIGNMENT: WFL  LUMBARAROM/PROM:  A/PROM A/PROM  eval  Flexion WFL  Extension WFL  Right lateral flexion WFL  Left lateral flexion WFL  Right rotation WFL  Left rotation WFL   (Blank rows = not tested)  LOWER EXTREMITY ROM:  WFL  LOWER EXTREMITY MMT:  Bil hip abduction 3/5, adduction,ext,flexion 4+/5; knees 5/5 PALPATION:   General  no TTP at glutes, abdomen, hips, pelvis                External Perineal Exam - consistent with decreased estrogen -  atrophy of vulval tissues                              Internal Pelvic Floor consistent with decreased estrogen - atrophy of vagina and constricted introitus   Patient confirms identification and approves PT to assess internal pelvic floor and treatment Yes  PELVIC MMT:   MMT eval  Vaginal 3/5, 6s, 4 reps  Internal Anal Sphincter   External Anal Sphincter   Puborectalis   Diastasis Recti   (Blank rows = not tested)        TONE: WFL  PROLAPSE: Possible grade 1 rectocele noted in hooklying with strong cough  TODAY'S TREATMENT:  DATE:   12/30/2022 EVAL Examination completed, findings reviewed, pt educated on POC, HEP, and bladder irritants and urge drill. Pt motivated to participate in PT and agreeable to attempt recommendations.    01/13/23: Pt educated on Voiding mechanics and breathing mechanics.  NMRE: all exercises are cued for breathing mechanics and pelvic floor mobility to coordinate these with functional mobility to decreased leakage and decreased IAP with activity 2x10 ball squeezes in hooklying  2x10 opp hand/knee ball press in hooklying  2x10 diaphragmatic breathing with pelvic floor contraction 2x10 bridges  Sidelying hip abduction with ball press 2x10 Sit to stand 2x10 first body weight second set 10# X10 5#  mario punch each Farmers carry 10# one hand 5# other for uneven strain   PATIENT EDUCATION:  Education details: bladder irritants and urge drill. [id number] Person educated: Patient Education method: Explanation, Demonstration, Tactile cues, Verbal cues, and Handouts Education comprehension: verbalized understanding and returned demonstration  HOME EXERCISE PROGRAM: [id number]  ASSESSMENT:  CLINICAL IMPRESSION: Patient presents for treatment today, focus on coordination of pelvic floor and breathing with strengthening exercises to decreased strain at pelvic floor and promote improved pelvic floor strength to decreased leakage. Pt tolerated well but needs moderate verbal cues for coordination of breathing to decreased holding of breath. Pt would benefit from additional PT to further address deficits.    OBJECTIVE IMPAIRMENTS: decreased coordination, decreased endurance, decreased strength, increased fascial restrictions, increased muscle spasms, impaired vision/preception, improper body mechanics, and pain.   ACTIVITY LIMITATIONS: continence and locomotion level  PARTICIPATION LIMITATIONS: interpersonal relationship, driving, shopping, community activity, and yard work  PERSONAL FACTORS: Time since onset of injury/illness/exacerbation and 1 comorbidity: medical history   are also affecting patient's functional outcome.   REHAB POTENTIAL: Good  CLINICAL DECISION MAKING: Stable/uncomplicated  EVALUATION COMPLEXITY: Low   GOALS: Goals reviewed with patient? Yes  SHORT TERM GOALS: Target date: 01/27/23  Pt to be I with HEP.  Baseline: Goal status: INITIAL  2.  Pt to report improved time between bladder voids to at least 1 hours for improved QOL with decreased urinary frequency.   Baseline:  Goal status: INITIAL  3.  Pt will have 25% less urgency due to bladder retraining and strengthening  Baseline:  Goal status: INITIAL  4.  Pt to demonstrate at least 3/5 pelvic floor strength  and ability to complete isometric for at least 8s for improved pelvic stability and decreased strain at pelvic floor/ decrease leakage.  Baseline:  Goal status: INITIAL  5.  Pt will report her BMs are complete due to improved bowel habits and evacuation techniques.  Baseline:  Goal status: INITIAL    LONG TERM GOALS: Target date: 03/31/23  Pt to be I with advanced HEP.  Baseline:  Goal status: INITIAL  2.  Pt to demonstrate at least 5/5 bil hip strength for improved pelvic stability and functional squats without leakage.  Baseline:  Goal status: INITIAL  3.  Pt to report improved time between bladder voids to at least 1.5 hours for improved QOL with decreased urinary frequency.   Baseline:  Goal status: INITIAL  4.  Pt to demonstrate improved coordination of pelvic floor and breathing mechanics with body weight squat with appropriate synergistic patterns to decrease pain and leakage at least 75% of the time.    Baseline:  Goal status: INITIAL  5.  Pt to demonstrate at least 4/5 pelvic floor strength for improved pelvic stability and decreased strain at pelvic floor/ decrease leakage.  Baseline:  Goal status: INITIAL  6.  Pt will have 50% less urgency due to bladder retraining and strengthening  Baseline:  Goal status: INITIAL  7.  Pt to report improved leakage symptoms with no urine or fecal leakage for at least one week for improved QOL.  Baseline:  Goal status: INITIAL  PLAN:  PT FREQUENCY: 1x/week  PT DURATION:  8 sessions  PLANNED INTERVENTIONS: Therapeutic exercises, Therapeutic activity, Neuromuscular re-education, Balance training, Gait training, Patient/Family education, Self Care, Spinal mobilization, Cryotherapy, Moist heat, scar mobilization, Taping, Biofeedback, and Manual therapy  PLAN FOR NEXT SESSION: bladder retraining/diary, review urge drill, breathing mechanics, coordination of breathing/core/pelvic floor, core and hip strengthening, pelvic floor  strengthening, bowel retraining, voiding mechanics  Otelia Sergeant, PT, DPT 01/12/2410:52 AM

## 2023-01-20 ENCOUNTER — Ambulatory Visit: Payer: Medicare Other | Admitting: Physical Therapy

## 2023-01-20 DIAGNOSIS — M6281 Muscle weakness (generalized): Secondary | ICD-10-CM | POA: Diagnosis not present

## 2023-01-20 DIAGNOSIS — R279 Unspecified lack of coordination: Secondary | ICD-10-CM

## 2023-01-20 DIAGNOSIS — R293 Abnormal posture: Secondary | ICD-10-CM

## 2023-01-20 NOTE — Therapy (Signed)
OUTPATIENT PHYSICAL THERAPY FEMALE PELVIC TREATMENT   Patient Name: Tammy Riley MRN: [id number] DOB:20-Mar-1941, 82 y.o., female Today's Date: 01/20/2023  END OF SESSION:  PT End of Session - 01/20/23 1140     Visit Number 3    Date for PT Re-Evaluation 03/31/23    Authorization Type [employer] MEDICARE    Authorization - Visit Number 10    PT Start Time 1145    PT Stop Time 1226    PT Time Calculation (min) 41 min    Activity Tolerance Patient tolerated treatment well    Behavior During Therapy WFL for tasks assessed/performed             Past Medical History:  Diagnosis Date   Allergic rhinitis    Anxiety    Arthritis    Asthma    Bladder problem    bladder leakage at times   Cataract    beginning of one   Depression    Hypothyroidism    Insomnia    Osteopenia    Thyroid disease    Past Surgical History:  Procedure Laterality Date   CATARACT EXTRACTION W/ INTRAOCULAR LENS  IMPLANT, BILATERAL  2016   COLONOSCOPY     WISDOM TOOTH EXTRACTION  1960   Patient Active Problem List   Diagnosis Date Noted   Dyspareunia in female 07/22/2020   Anemia 07/22/2020   Herniation of rectum into vagina 07/22/2020   Urge incontinence of urine 07/22/2020   Trigger ring finger of right hand 02/23/2016   Cervical spondylosis without myelopathy 12/17/2015   Madelung's deformity 12/17/2015   Trigger finger of right thumb 12/17/2015   Chest pain 05/13/2014    PCP: Creola Corn, MD  REFERRING PROVIDER: Ranae Pila, MD  REFERRING DIAG: N39.41 (ICD-10-CM) - Urge incontinence  THERAPY DIAG:  Muscle weakness (generalized)  Unspecified lack of coordination  Abnormal posture  Rationale for Evaluation and Treatment: Rehabilitation  ONSET DATE: 68 years ago   SUBJECTIVE:                                                                                                                                                                                            SUBJECTIVE STATEMENT: Pt reports she hasn't noticed an improvement yet.    Fluid intake: Yes: water - 2-3 8oz glasses coffee - 4x 1/2 cups daily    PAIN:  Are you having pain? No   PRECAUTIONS: None  WEIGHT BEARING RESTRICTIONS: No  FALLS:  Has patient fallen in last 6 months? No  LIVING ENVIRONMENT: Lives with: lives with their family Lives in: House/apartment   OCCUPATION: retired  PLOF:  Independent  PATIENT GOALS: to have less leakage  PERTINENT HISTORY:  fecal incontinence, rectocele,   Sexual abuse: No  BOWEL MOVEMENT: Pain with bowel movement: No Type of bowel movement:Type (Bristol Stool Scale) 4-5, Frequency 3x per morning, and Strain No - but does rock side to side to empty Fully empty rectum: No Leakage: Yes: when can't get there quickly enough and with passing gas Pads: Yes: large pads - changes several times for stool or urine Fiber supplement: No  URINATION: Pain with urination: No Fully empty bladder: Yes:   Stream: Weak Urgency: Yes:   Frequency: sometimes every 45 mins - usually small amounts - gets up several times at night Leakage: Urge to void, Walking to the bathroom, and sometimes unaware, and sometimes at night and unknown Pads: Yes: large pads - changes several times for stool or urine  INTERCOURSE: Pain with intercourse:  not active - due to pain Ability to have vaginal penetration:  No - due to dryness  Climax: not active Marinoff Scale: 3/3  PREGNANCY: Vaginal deliveries 2 Tearing Yes: large babies but unsure grades C-section deliveries 0 Currently pregnant No  PROLAPSE: Rectocele per chart - denies symptoms   OBJECTIVE:   DIAGNOSTIC FINDINGS:    COGNITION: Overall cognitive status: Within functional limits for tasks assessed     SENSATION: Light touch: Appears intact Proprioception: Appears intact  MUSCLE LENGTH: Bil hamstrings and adductor WFL    POSTURE: rounded shoulders and posterior pelvic  tilt  PELVIC ALIGNMENT: WFL  LUMBARAROM/PROM:  A/PROM A/PROM  eval  Flexion WFL  Extension WFL  Right lateral flexion WFL  Left lateral flexion WFL  Right rotation WFL  Left rotation WFL   (Blank rows = not tested)  LOWER EXTREMITY ROM:  WFL  LOWER EXTREMITY MMT:  Bil hip abduction 3/5, adduction,ext,flexion 4+/5; knees 5/5 PALPATION:   General  no TTP at glutes, abdomen, hips, pelvis                External Perineal Exam - consistent with decreased estrogen -  atrophy of vulval tissues                              Internal Pelvic Floor consistent with decreased estrogen - atrophy of vagina and constricted introitus   Patient confirms identification and approves PT to assess internal pelvic floor and treatment Yes  PELVIC MMT:   MMT eval  Vaginal 3/5, 6s, 4 reps  Internal Anal Sphincter   External Anal Sphincter   Puborectalis   Diastasis Recti   (Blank rows = not tested)        TONE: WFL  PROLAPSE: Possible grade 1 rectocele noted in hooklying with strong cough  TODAY'S TREATMENT:                                                                                                                              DATE:  12/30/2022 EVAL Examination completed, findings reviewed, pt educated on POC, HEP, and bladder irritants and urge drill. Pt motivated to participate in PT and agreeable to attempt recommendations.    01/13/23: Pt educated on Voiding mechanics and breathing mechanics.  NMRE: all exercises are cued for breathing mechanics and pelvic floor mobility to coordinate these with functional mobility to decreased leakage and decreased IAP with activity 2x10 ball squeezes in hooklying  2x10 opp hand/knee ball press in hooklying  2x10 diaphragmatic breathing with pelvic floor contraction 2x10 bridges  Sidelying hip abduction with ball press 2x10 Sit to stand 2x10 first body weight second set 10# X10 5# mario punch each [employer] carry 10# one hand 5# other for  uneven strain   01/20/23: Therapeutic activity: Educated and handout given on voiding mechanics, and abdominal massage X5 CW/CCW abdominal massage on patient Pt had several questions about this but all answered and pt denied additional questions at end of session.   PATIENT EDUCATION:  Education details: bladder irritants and urge drill. [id number] Person educated: Patient Education method: Explanation, Demonstration, Tactile cues, Verbal cues, and Handouts Education comprehension: verbalized understanding and returned demonstration  HOME EXERCISE PROGRAM: [id number]  ASSESSMENT:  CLINICAL IMPRESSION: Patient presents for treatment today, focus on educated and manual work for abdominal massage and handouts given as well as for voiding mechanics to decreased fecal incontinence. Pt would benefit from additional PT to further address deficits.    OBJECTIVE IMPAIRMENTS: decreased coordination, decreased endurance, decreased strength, increased fascial restrictions, increased muscle spasms, impaired vision/preception, improper body mechanics, and pain.   ACTIVITY LIMITATIONS: continence and locomotion level  PARTICIPATION LIMITATIONS: interpersonal relationship, driving, shopping, community activity, and yard work  PERSONAL FACTORS: Time since onset of injury/illness/exacerbation and 1 comorbidity: medical history   are also affecting patient's functional outcome.   REHAB POTENTIAL: Good  CLINICAL DECISION MAKING: Stable/uncomplicated  EVALUATION COMPLEXITY: Low   GOALS: Goals reviewed with patient? Yes  SHORT TERM GOALS: Target date: 01/27/23  Pt to be I with HEP.  Baseline: Goal status: INITIAL  2.  Pt to report improved time between bladder voids to at least 1 hours for improved QOL with decreased urinary frequency.   Baseline:  Goal status: INITIAL  3.  Pt will have 25% less urgency due to bladder retraining and strengthening  Baseline:  Goal status: INITIAL  4.  Pt to  demonstrate at least 3/5 pelvic floor strength and ability to complete isometric for at least 8s for improved pelvic stability and decreased strain at pelvic floor/ decrease leakage.  Baseline:  Goal status: INITIAL  5.  Pt will report her BMs are complete due to improved bowel habits and evacuation techniques.  Baseline:  Goal status: INITIAL    LONG TERM GOALS: Target date: 03/31/23  Pt to be I with advanced HEP.  Baseline:  Goal status: INITIAL  2.  Pt to demonstrate at least 5/5 bil hip strength for improved pelvic stability and functional squats without leakage.  Baseline:  Goal status: INITIAL  3.  Pt to report improved time between bladder voids to at least 1.5 hours for improved QOL with decreased urinary frequency.   Baseline:  Goal status: INITIAL  4.  Pt to demonstrate improved coordination of pelvic floor and breathing mechanics with body weight squat with appropriate synergistic patterns to decrease pain and leakage at least 75% of the time.    Baseline:  Goal status: INITIAL  5.  Pt to demonstrate at least 4/5 pelvic floor strength for improved pelvic  stability and decreased strain at pelvic floor/ decrease leakage.  Baseline:  Goal status: INITIAL  6.  Pt will have 50% less urgency due to bladder retraining and strengthening  Baseline:  Goal status: INITIAL  7.  Pt to report improved leakage symptoms with no urine or fecal leakage for at least one week for improved QOL.  Baseline:  Goal status: INITIAL  PLAN:  PT FREQUENCY: 1x/week  PT DURATION:  8 sessions  PLANNED INTERVENTIONS: Therapeutic exercises, Therapeutic activity, Neuromuscular re-education, Balance training, Gait training, Patient/Family education, Self Care, Spinal mobilization, Cryotherapy, Moist heat, scar mobilization, Taping, Biofeedback, and Manual therapy  PLAN FOR NEXT SESSION: bladder retraining/diary, review urge drill, breathing mechanics, coordination of breathing/core/pelvic  floor, core and hip strengthening, pelvic floor strengthening, bowel retraining, voiding mechanics  Otelia Sergeant, PT, DPT 05/09/242:00 PM

## 2023-01-26 ENCOUNTER — Ambulatory Visit: Payer: Medicare Other | Admitting: Physical Therapy

## 2023-01-26 DIAGNOSIS — R293 Abnormal posture: Secondary | ICD-10-CM

## 2023-01-26 DIAGNOSIS — R279 Unspecified lack of coordination: Secondary | ICD-10-CM

## 2023-01-26 DIAGNOSIS — M6281 Muscle weakness (generalized): Secondary | ICD-10-CM

## 2023-01-26 NOTE — Therapy (Signed)
OUTPATIENT PHYSICAL THERAPY FEMALE PELVIC TREATMENT   Patient Name: Tammy Riley MRN: [id number] DOB:13-Nov-1940, 82 y.o., female Today's Date: 01/26/2023  END OF SESSION:  PT End of Session - 01/26/23 1537     Visit Number 4    Date for PT Re-Evaluation 03/31/23    Authorization Type [employer] MEDICARE    Authorization - Visit Number 10    PT Start Time 1530    PT Stop Time 1610    PT Time Calculation (min) 40 min    Activity Tolerance Patient tolerated treatment well    Behavior During Therapy WFL for tasks assessed/performed              Past Medical History:  Diagnosis Date   Allergic rhinitis    Anxiety    Arthritis    Asthma    Bladder problem    bladder leakage at times   Cataract    beginning of one   Depression    Hypothyroidism    Insomnia    Osteopenia    Thyroid disease    Past Surgical History:  Procedure Laterality Date   CATARACT EXTRACTION W/ INTRAOCULAR LENS  IMPLANT, BILATERAL  2016   COLONOSCOPY     WISDOM TOOTH EXTRACTION  1960   Patient Active Problem List   Diagnosis Date Noted   Dyspareunia in female 07/22/2020   Anemia 07/22/2020   Herniation of rectum into vagina 07/22/2020   Urge incontinence of urine 07/22/2020   Trigger ring finger of right hand 02/23/2016   Cervical spondylosis without myelopathy 12/17/2015   Madelung's deformity 12/17/2015   Trigger finger of right thumb 12/17/2015   Chest pain 05/13/2014    PCP: Creola Corn, MD  REFERRING PROVIDER: Ranae Pila, MD  REFERRING DIAG: N39.41 (ICD-10-CM) - Urge incontinence  THERAPY DIAG:  Muscle weakness (generalized)  Abnormal posture  Unspecified lack of coordination  Rationale for Evaluation and Treatment: Rehabilitation  ONSET DATE: 68 years ago   SUBJECTIVE:                                                                                                                                                                                            SUBJECTIVE STATEMENT: Pt reports she hasn't noticed an improvement yet.    Fluid intake: Yes: water - 2-3 8oz glasses coffee - 4x 1/2 cups daily    PAIN:  Are you having pain? No   PRECAUTIONS: None  WEIGHT BEARING RESTRICTIONS: No  FALLS:  Has patient fallen in last 6 months? No  LIVING ENVIRONMENT: Lives with: lives with their family Lives in: House/apartment   OCCUPATION: retired  PLOF: Independent  PATIENT GOALS: to have less leakage  PERTINENT HISTORY:  fecal incontinence, rectocele,   Sexual abuse: No  BOWEL MOVEMENT: Pain with bowel movement: No Type of bowel movement:Type (Bristol Stool Scale) 4-5, Frequency 3x per morning, and Strain No - but does rock side to side to empty Fully empty rectum: No Leakage: Yes: when can't get there quickly enough and with passing gas Pads: Yes: large pads - changes several times for stool or urine Fiber supplement: No  URINATION: Pain with urination: No Fully empty bladder: Yes:   Stream: Weak Urgency: Yes:   Frequency: sometimes every 45 mins - usually small amounts - gets up several times at night Leakage: Urge to void, Walking to the bathroom, and sometimes unaware, and sometimes at night and unknown Pads: Yes: large pads - changes several times for stool or urine  INTERCOURSE: Pain with intercourse:  not active - due to pain Ability to have vaginal penetration:  No - due to dryness  Climax: not active Marinoff Scale: 3/3  PREGNANCY: Vaginal deliveries 2 Tearing Yes: large babies but unsure grades C-section deliveries 0 Currently pregnant No  PROLAPSE: Rectocele per chart - denies symptoms   OBJECTIVE:   DIAGNOSTIC FINDINGS:    COGNITION: Overall cognitive status: Within functional limits for tasks assessed     SENSATION: Light touch: Appears intact Proprioception: Appears intact  MUSCLE LENGTH: Bil hamstrings and adductor WFL    POSTURE: rounded shoulders and posterior pelvic  tilt  PELVIC ALIGNMENT: WFL  LUMBARAROM/PROM:  A/PROM A/PROM  eval  Flexion WFL  Extension WFL  Right lateral flexion WFL  Left lateral flexion WFL  Right rotation WFL  Left rotation WFL   (Blank rows = not tested)  LOWER EXTREMITY ROM:  WFL  LOWER EXTREMITY MMT:  Bil hip abduction 3/5, adduction,ext,flexion 4+/5; knees 5/5 PALPATION:   General  no TTP at glutes, abdomen, hips, pelvis                External Perineal Exam - consistent with decreased estrogen -  atrophy of vulval tissues                              Internal Pelvic Floor consistent with decreased estrogen - atrophy of vagina and constricted introitus   Patient confirms identification and approves PT to assess internal pelvic floor and treatment Yes  PELVIC MMT:   MMT eval  Vaginal 3/5, 6s, 4 reps  Internal Anal Sphincter   External Anal Sphincter   Puborectalis   Diastasis Recti   (Blank rows = not tested)        TONE: WFL  PROLAPSE: Possible grade 1 rectocele noted in hooklying with strong cough  TODAY'S TREATMENT:  DATE:    01/13/23: Pt educated on Voiding mechanics and breathing mechanics.  NMRE: all exercises are cued for breathing mechanics and pelvic floor mobility to coordinate these with functional mobility to decreased leakage and decreased IAP with activity 2x10 ball squeezes in hooklying  2x10 opp hand/knee ball press in hooklying  2x10 diaphragmatic breathing with pelvic floor contraction 2x10 bridges  Sidelying hip abduction with ball press 2x10 Sit to stand 2x10 first body weight second set 10# X10 5# mario punch each [employer] carry 10# one hand 5# other for uneven strain   01/20/23: Therapeutic activity: Educated and handout given on voiding mechanics, and abdominal massage X5 CW/CCW abdominal massage on patient Pt had several questions about this  but all answered and pt denied additional questions at end of session.  01/26/23  Therapeutic exercise  2x10 Sit to stand 10# 2x10 lateral steps 6# each 2x10 lunges forward press 6# Standing OHP 6# (1 weight) X5 x5 quick flicks, stand x5 quick flicks, walk 10' Seated quick flicks 2x10with towel roll for improved sensation Seated isometrics 8sx10 with towel roll for improved sensation Palloffs 2x10 5# at power tower  PATIENT EDUCATION:  Education details: bladder irritants and urge drill. [id number] Person educated: Patient Education method: Explanation, Demonstration, Tactile cues, Verbal cues, and Handouts Education comprehension: verbalized understanding and returned demonstration  HOME EXERCISE PROGRAM: [id number]  ASSESSMENT:  CLINICAL IMPRESSION: Patient presents for treatment today, focus on educated and manual work for abdominal massage and handouts given as well as for voiding mechanics to decreased fecal incontinence. Pt would benefit from additional PT to further address deficits.    OBJECTIVE IMPAIRMENTS: decreased coordination, decreased endurance, decreased strength, increased fascial restrictions, increased muscle spasms, impaired vision/preception, improper body mechanics, and pain.   ACTIVITY LIMITATIONS: continence and locomotion level  PARTICIPATION LIMITATIONS: interpersonal relationship, driving, shopping, community activity, and yard work  PERSONAL FACTORS: Time since onset of injury/illness/exacerbation and 1 comorbidity: medical history   are also affecting patient's functional outcome.   REHAB POTENTIAL: Good  CLINICAL DECISION MAKING: Stable/uncomplicated  EVALUATION COMPLEXITY: Low   GOALS: Goals reviewed with patient? Yes  SHORT TERM GOALS: Target date: 01/27/23  Pt to be I with HEP.  Baseline: Goal status: INITIAL  2.  Pt to report improved time between bladder voids to at least 1 hours for improved QOL with decreased urinary frequency.    Baseline:  Goal status: INITIAL  3.  Pt will have 25% less urgency due to bladder retraining and strengthening  Baseline:  Goal status: INITIAL  4.  Pt to demonstrate at least 3/5 pelvic floor strength and ability to complete isometric for at least 8s for improved pelvic stability and decreased strain at pelvic floor/ decrease leakage.  Baseline:  Goal status: INITIAL  5.  Pt will report her BMs are complete due to improved bowel habits and evacuation techniques.  Baseline:  Goal status: INITIAL    LONG TERM GOALS: Target date: 03/31/23  Pt to be I with advanced HEP.  Baseline:  Goal status: INITIAL  2.  Pt to demonstrate at least 5/5 bil hip strength for improved pelvic stability and functional squats without leakage.  Baseline:  Goal status: INITIAL  3.  Pt to report improved time between bladder voids to at least 1.5 hours for improved QOL with decreased urinary frequency.   Baseline:  Goal status: INITIAL  4.  Pt to demonstrate improved coordination of pelvic floor and breathing mechanics with body weight squat with appropriate synergistic patterns to decrease  pain and leakage at least 75% of the time.    Baseline:  Goal status: INITIAL  5.  Pt to demonstrate at least 4/5 pelvic floor strength for improved pelvic stability and decreased strain at pelvic floor/ decrease leakage.  Baseline:  Goal status: INITIAL  6.  Pt will have 50% less urgency due to bladder retraining and strengthening  Baseline:  Goal status: INITIAL  7.  Pt to report improved leakage symptoms with no urine or fecal leakage for at least one week for improved QOL.  Baseline:  Goal status: INITIAL  PLAN:  PT FREQUENCY: 1x/week  PT DURATION:  8 sessions  PLANNED INTERVENTIONS: Therapeutic exercises, Therapeutic activity, Neuromuscular re-education, Balance training, Gait training, Patient/Family education, Self Care, Spinal mobilization, Cryotherapy, Moist heat, scar mobilization, Taping,  Biofeedback, and Manual therapy  PLAN FOR NEXT SESSION: bladder retraining/diary, review urge drill, breathing mechanics, coordination of breathing/core/pelvic floor, core and hip strengthening, pelvic floor strengthening, bowel retraining, voiding mechanics  Otelia Sergeant, PT, DPT 05/15/244:15 PM

## 2023-02-03 ENCOUNTER — Ambulatory Visit: Payer: Medicare Other | Admitting: Physical Therapy

## 2023-02-03 DIAGNOSIS — M6281 Muscle weakness (generalized): Secondary | ICD-10-CM

## 2023-02-03 DIAGNOSIS — R279 Unspecified lack of coordination: Secondary | ICD-10-CM

## 2023-02-03 DIAGNOSIS — R293 Abnormal posture: Secondary | ICD-10-CM

## 2023-02-03 NOTE — Therapy (Signed)
OUTPATIENT PHYSICAL THERAPY FEMALE PELVIC TREATMENT   Patient Name: Tammy Riley MRN: [id number] DOB:04/16/1941, 82 y.o., female Today's Date: 02/03/2023  END OF SESSION:  PT End of Session - 02/03/23 1222     Visit Number 5    Date for PT Re-Evaluation 03/31/23    Authorization Type [employer] MEDICARE    PT Start Time 1230    PT Stop Time 1309    PT Time Calculation (min) 39 min    Activity Tolerance Patient tolerated treatment well    Behavior During Therapy WFL for tasks assessed/performed              Past Medical History:  Diagnosis Date   Allergic rhinitis    Anxiety    Arthritis    Asthma    Bladder problem    bladder leakage at times   Cataract    beginning of one   Depression    Hypothyroidism    Insomnia    Osteopenia    Thyroid disease    Past Surgical History:  Procedure Laterality Date   CATARACT EXTRACTION W/ INTRAOCULAR LENS  IMPLANT, BILATERAL  2016   COLONOSCOPY     WISDOM TOOTH EXTRACTION  1960   Patient Active Problem List   Diagnosis Date Noted   Dyspareunia in female 07/22/2020   Anemia 07/22/2020   Herniation of rectum into vagina 07/22/2020   Urge incontinence of urine 07/22/2020   Trigger ring finger of right hand 02/23/2016   Cervical spondylosis without myelopathy 12/17/2015   Madelung's deformity 12/17/2015   Trigger finger of right thumb 12/17/2015   Chest pain 05/13/2014    PCP: Creola Corn, MD  REFERRING PROVIDER: Ranae Pila, MD  REFERRING DIAG: N39.41 (ICD-10-CM) - Urge incontinence  THERAPY DIAG:  Muscle weakness (generalized)  Abnormal posture  Unspecified lack of coordination  Rationale for Evaluation and Treatment: Rehabilitation  ONSET DATE: 67 years ago   SUBJECTIVE:                                                                                                                                                                                           SUBJECTIVE STATEMENT: Less leakage  overall with urine - no accidents in the last week with stool. In looking back notes she thinks she has patterns with fecal leakage when she has not flashes.   Fluid intake: Yes: water - 2-3 8oz glasses coffee - 4x 1/2 cups daily    PAIN:  Are you having pain? No   PRECAUTIONS: None  WEIGHT BEARING RESTRICTIONS: No  FALLS:  Has patient fallen in last 6 months? No  LIVING ENVIRONMENT:  Lives with: lives with their family Lives in: House/apartment   OCCUPATION: retired  PLOF: Independent  PATIENT GOALS: to have less leakage  PERTINENT HISTORY:  fecal incontinence, rectocele,   Sexual abuse: No  BOWEL MOVEMENT: Pain with bowel movement: No Type of bowel movement:Type (Bristol Stool Scale) 4-5, Frequency 3x per morning, and Strain No - but does rock side to side to empty Fully empty rectum: No Leakage: Yes: when can't get there quickly enough and with passing gas Pads: Yes: large pads - changes several times for stool or urine Fiber supplement: No  URINATION: Pain with urination: No Fully empty bladder: Yes:   Stream: Weak Urgency: Yes:   Frequency: sometimes every 45 mins - usually small amounts - gets up several times at night Leakage: Urge to void, Walking to the bathroom, and sometimes unaware, and sometimes at night and unknown Pads: Yes: large pads - changes several times for stool or urine  INTERCOURSE: Pain with intercourse:  not active - due to pain Ability to have vaginal penetration:  No - due to dryness  Climax: not active Marinoff Scale: 3/3  PREGNANCY: Vaginal deliveries 2 Tearing Yes: large babies but unsure grades C-section deliveries 0 Currently pregnant No  PROLAPSE: Rectocele per chart - denies symptoms   OBJECTIVE:   DIAGNOSTIC FINDINGS:    COGNITION: Overall cognitive status: Within functional limits for tasks assessed     SENSATION: Light touch: Appears intact Proprioception: Appears intact  MUSCLE LENGTH: Bil hamstrings and  adductor WFL    POSTURE: rounded shoulders and posterior pelvic tilt  PELVIC ALIGNMENT: WFL  LUMBARAROM/PROM:  A/PROM A/PROM  eval  Flexion WFL  Extension WFL  Right lateral flexion WFL  Left lateral flexion WFL  Right rotation WFL  Left rotation WFL   (Blank rows = not tested)  LOWER EXTREMITY ROM:  WFL  LOWER EXTREMITY MMT:  Bil hip abduction 3/5, adduction,ext,flexion 4+/5; knees 5/5 PALPATION:   General  no TTP at glutes, abdomen, hips, pelvis                External Perineal Exam - consistent with decreased estrogen -  atrophy of vulval tissues                              Internal Pelvic Floor consistent with decreased estrogen - atrophy of vagina and constricted introitus   Patient confirms identification and approves PT to assess internal pelvic floor and treatment Yes  PELVIC MMT:   MMT eval  Vaginal 3/5, 6s, 4 reps  Internal Anal Sphincter   External Anal Sphincter   Puborectalis   Diastasis Recti   (Blank rows = not tested)        TONE: WFL  PROLAPSE: Possible grade 1 rectocele noted in hooklying with strong cough  TODAY'S TREATMENT:  DATE:   02/03/23  Pt educated on goals met, DC planning and recommended to follow up with referring provider about any medication questions pt had including about estrogen cream use/prescription and if she had recommendations about fiber supplements for bulking stool.     PATIENT EDUCATION:  Education details: bladder irritants and urge drill. [id number] Person educated: Patient Education method: Explanation, Demonstration, Tactile cues, Verbal cues, and Handouts Education comprehension: verbalized understanding and returned demonstration  HOME EXERCISE PROGRAM: [id number]  ASSESSMENT:  CLINICAL IMPRESSION: Patient presents for treatment today, focus on DC planning and goal review. Pt  denied additional needs from PT after session and session ended early at pt request. All STG met and 5/7 LTG met.    OBJECTIVE IMPAIRMENTS: decreased coordination, decreased endurance, decreased strength, increased fascial restrictions, increased muscle spasms, impaired vision/preception, improper body mechanics, and pain.   ACTIVITY LIMITATIONS: continence and locomotion level  PARTICIPATION LIMITATIONS: interpersonal relationship, driving, shopping, community activity, and yard work  PERSONAL FACTORS: Time since onset of injury/illness/exacerbation and 1 comorbidity: medical history   are also affecting patient's functional outcome.   REHAB POTENTIAL: Good  CLINICAL DECISION MAKING: Stable/uncomplicated  EVALUATION COMPLEXITY: Low   GOALS: Goals reviewed with patient? Yes  SHORT TERM GOALS: Target date: 01/27/23  Pt to be I with HEP.  Baseline: Goal status: MET  2.  Pt to report improved time between bladder voids to at least 1 hours for improved QOL with decreased urinary frequency.   Baseline:  Goal status: MET  3.  Pt will have 25% less urgency due to bladder retraining and strengthening  Baseline:  Goal status: MET  4.  Pt to demonstrate at least 3/5 pelvic floor strength and ability to complete isometric for at least 8s for improved pelvic stability and decreased strain at pelvic floor/ decrease leakage.  Baseline:  Goal status: MET  5.  Pt will report her BMs are complete due to improved bowel habits and evacuation techniques.  Baseline:  Goal status: MET    LONG TERM GOALS: Target date: 03/31/23  Pt to be I with advanced HEP.  Baseline:  Goal status: MET  2.  Pt to demonstrate at least 5/5 bil hip strength for improved pelvic stability and functional squats without leakage.  Baseline:  Goal status: MET  3.  Pt to report improved time between bladder voids to at least 1.5 hours for improved QOL with decreased urinary frequency.   Baseline:  Goal status:  MET  4.  Pt to demonstrate improved coordination of pelvic floor and breathing mechanics with body weight squat with appropriate synergistic patterns to decrease pain and leakage at least 75% of the time.    Baseline:  Goal status: MET  5.  Pt to demonstrate at least 4/5 pelvic floor strength for improved pelvic stability and decreased strain at pelvic floor/ decrease leakage.  Baseline:  Goal status: pt denied need for additional internal  6.  Pt will have 50% less urgency due to bladder retraining and strengthening  Baseline:  Goal status: NOT MET  7.  Pt to report improved leakage symptoms with no urine or fecal leakage for at least one week for improved QOL.  Baseline:  Goal status: MET 02/03/23   PLAN:  PT FREQUENCY: 1x/week  PT DURATION:  8 sessions  PLANNED INTERVENTIONS: Therapeutic exercises, Therapeutic activity, Neuromuscular re-education, Balance training, Gait training, Patient/Family education, Self Care, Spinal mobilization, Cryotherapy, Moist heat, scar mobilization, Taping, Biofeedback, and Manual therapy  PLAN FOR NEXT SESSION:  PHYSICAL THERAPY DISCHARGE SUMMARY  Visits from Start of Care: 5  Current functional level related to goals / functional outcomes: All STG met, 5/7 LTG met with one not met due to pt deferring internal reassessment   Remaining deficits: Does continue to have leakage but less frequent, smaller amounts   Education / Equipment: HEP   Patient agrees to discharge. Patient goals were partially met. Patient is being discharged due to being pleased with the current functional level.  Otelia Sergeant, PT, DPT 05/23/241:09 PM

## 2023-02-10 ENCOUNTER — Encounter: Payer: Medicare Other | Admitting: Physical Therapy

## 2023-02-17 ENCOUNTER — Encounter: Payer: Medicare Other | Admitting: Physical Therapy

## 2023-02-24 ENCOUNTER — Encounter: Payer: Medicare Other | Admitting: Physical Therapy

## 2023-11-14 ENCOUNTER — Ambulatory Visit (HOSPITAL_COMMUNITY)
Admission: RE | Admit: 2023-11-14 | Discharge: 2023-11-14 | Disposition: A | Discharge status: Home / Self Care | Source: Ambulatory Visit | Attending: Internal Medicine | Admitting: Internal Medicine

## 2023-11-14 ENCOUNTER — Other Ambulatory Visit (HOSPITAL_COMMUNITY): Payer: Self-pay | Admitting: Internal Medicine

## 2023-11-14 DIAGNOSIS — R600 Localized edema: Secondary | ICD-10-CM | POA: Diagnosis not present

## 2023-12-01 ENCOUNTER — Other Ambulatory Visit: Payer: Self-pay | Admitting: Family Medicine

## 2023-12-01 DIAGNOSIS — R2241 Localized swelling, mass and lump, right lower limb: Secondary | ICD-10-CM

## 2023-12-06 ENCOUNTER — Ambulatory Visit
Admission: RE | Admit: 2023-12-06 | Discharge: 2023-12-06 | Disposition: A | Discharge status: Home / Self Care | Source: Ambulatory Visit | Attending: Family Medicine | Admitting: Family Medicine

## 2023-12-06 DIAGNOSIS — R2241 Localized swelling, mass and lump, right lower limb: Secondary | ICD-10-CM

## 2023-12-06 MED ORDER — IOPAMIDOL (ISOVUE-370) INJECTION 76%
200.0000 mL | Freq: Once | INTRAVENOUS | Status: AC | PRN
  Administered 2023-12-06: 75 mL via INTRAVENOUS

## 2023-12-23 DIAGNOSIS — Z961 Presence of intraocular lens: Secondary | ICD-10-CM | POA: Diagnosis not present

## 2023-12-23 DIAGNOSIS — H04123 Dry eye syndrome of bilateral lacrimal glands: Secondary | ICD-10-CM | POA: Diagnosis not present

## 2023-12-23 DIAGNOSIS — H52203 Unspecified astigmatism, bilateral: Secondary | ICD-10-CM | POA: Diagnosis not present

## 2023-12-23 DIAGNOSIS — H26493 Other secondary cataract, bilateral: Secondary | ICD-10-CM | POA: Diagnosis not present

## 2024-01-05 DIAGNOSIS — R600 Localized edema: Secondary | ICD-10-CM | POA: Diagnosis not present

## 2024-01-05 DIAGNOSIS — E039 Hypothyroidism, unspecified: Secondary | ICD-10-CM | POA: Diagnosis not present

## 2024-01-05 DIAGNOSIS — R030 Elevated blood-pressure reading, without diagnosis of hypertension: Secondary | ICD-10-CM | POA: Diagnosis not present

## 2024-01-26 DIAGNOSIS — M8580 Other specified disorders of bone density and structure, unspecified site: Secondary | ICD-10-CM | POA: Diagnosis not present

## 2024-01-26 DIAGNOSIS — R5383 Other fatigue: Secondary | ICD-10-CM | POA: Diagnosis not present

## 2024-01-26 DIAGNOSIS — R739 Hyperglycemia, unspecified: Secondary | ICD-10-CM | POA: Diagnosis not present

## 2024-01-26 DIAGNOSIS — E039 Hypothyroidism, unspecified: Secondary | ICD-10-CM | POA: Diagnosis not present

## 2024-01-26 DIAGNOSIS — R030 Elevated blood-pressure reading, without diagnosis of hypertension: Secondary | ICD-10-CM | POA: Diagnosis not present

## 2024-02-02 DIAGNOSIS — F1320 Sedative, hypnotic or anxiolytic dependence, uncomplicated: Secondary | ICD-10-CM | POA: Diagnosis not present

## 2024-02-02 DIAGNOSIS — M1990 Unspecified osteoarthritis, unspecified site: Secondary | ICD-10-CM | POA: Diagnosis not present

## 2024-02-02 DIAGNOSIS — R82998 Other abnormal findings in urine: Secondary | ICD-10-CM | POA: Diagnosis not present

## 2024-02-02 DIAGNOSIS — Z1389 Encounter for screening for other disorder: Secondary | ICD-10-CM | POA: Diagnosis not present

## 2024-02-02 DIAGNOSIS — R634 Abnormal weight loss: Secondary | ICD-10-CM | POA: Diagnosis not present

## 2024-02-02 DIAGNOSIS — Z1331 Encounter for screening for depression: Secondary | ICD-10-CM | POA: Diagnosis not present

## 2024-02-02 DIAGNOSIS — Z0000 Encounter for general adult medical examination without abnormal findings: Secondary | ICD-10-CM | POA: Diagnosis not present

## 2024-02-02 DIAGNOSIS — F325 Major depressive disorder, single episode, in full remission: Secondary | ICD-10-CM | POA: Diagnosis not present

## 2024-02-02 DIAGNOSIS — E039 Hypothyroidism, unspecified: Secondary | ICD-10-CM | POA: Diagnosis not present

## 2024-02-02 DIAGNOSIS — R413 Other amnesia: Secondary | ICD-10-CM | POA: Diagnosis not present

## 2024-02-02 DIAGNOSIS — R600 Localized edema: Secondary | ICD-10-CM | POA: Diagnosis not present

## 2024-02-02 DIAGNOSIS — G4700 Insomnia, unspecified: Secondary | ICD-10-CM | POA: Diagnosis not present

## 2024-02-02 DIAGNOSIS — F419 Anxiety disorder, unspecified: Secondary | ICD-10-CM | POA: Diagnosis not present

## 2024-02-02 DIAGNOSIS — R32 Unspecified urinary incontinence: Secondary | ICD-10-CM | POA: Diagnosis not present

## 2024-02-02 DIAGNOSIS — R5383 Other fatigue: Secondary | ICD-10-CM | POA: Diagnosis not present

## 2024-02-16 DIAGNOSIS — H26492 Other secondary cataract, left eye: Secondary | ICD-10-CM | POA: Diagnosis not present

## 2024-02-16 DIAGNOSIS — H26491 Other secondary cataract, right eye: Secondary | ICD-10-CM | POA: Diagnosis not present

## 2024-02-28 DIAGNOSIS — L812 Freckles: Secondary | ICD-10-CM | POA: Diagnosis not present

## 2024-02-28 DIAGNOSIS — L821 Other seborrheic keratosis: Secondary | ICD-10-CM | POA: Diagnosis not present

## 2024-02-28 DIAGNOSIS — L570 Actinic keratosis: Secondary | ICD-10-CM | POA: Diagnosis not present

## 2024-02-28 DIAGNOSIS — Z85828 Personal history of other malignant neoplasm of skin: Secondary | ICD-10-CM | POA: Diagnosis not present

## 2024-02-28 DIAGNOSIS — L820 Inflamed seborrheic keratosis: Secondary | ICD-10-CM | POA: Diagnosis not present

## 2024-02-28 DIAGNOSIS — D225 Melanocytic nevi of trunk: Secondary | ICD-10-CM | POA: Diagnosis not present

## 2024-02-28 DIAGNOSIS — D485 Neoplasm of uncertain behavior of skin: Secondary | ICD-10-CM | POA: Diagnosis not present

## 2024-02-28 DIAGNOSIS — L218 Other seborrheic dermatitis: Secondary | ICD-10-CM | POA: Diagnosis not present

## 2024-02-28 DIAGNOSIS — D1801 Hemangioma of skin and subcutaneous tissue: Secondary | ICD-10-CM | POA: Diagnosis not present

## 2024-03-12 DIAGNOSIS — H0102A Squamous blepharitis right eye, upper and lower eyelids: Secondary | ICD-10-CM | POA: Diagnosis not present

## 2024-03-12 DIAGNOSIS — H04123 Dry eye syndrome of bilateral lacrimal glands: Secondary | ICD-10-CM | POA: Diagnosis not present

## 2024-03-12 DIAGNOSIS — H5789 Other specified disorders of eye and adnexa: Secondary | ICD-10-CM | POA: Diagnosis not present

## 2024-03-12 DIAGNOSIS — H0289 Other specified disorders of eyelid: Secondary | ICD-10-CM | POA: Diagnosis not present

## 2024-03-12 DIAGNOSIS — Z1329 Encounter for screening for other suspected endocrine disorder: Secondary | ICD-10-CM | POA: Diagnosis not present

## 2024-03-12 DIAGNOSIS — B880 Other acariasis: Secondary | ICD-10-CM | POA: Diagnosis not present

## 2024-05-16 ENCOUNTER — Emergency Department (HOSPITAL_COMMUNITY)
Admission: EM | Admit: 2024-05-16 | Discharge: 2024-05-16 | Disposition: A | Discharge status: Home / Self Care | Attending: Emergency Medicine | Admitting: Emergency Medicine

## 2024-05-16 ENCOUNTER — Other Ambulatory Visit: Payer: Self-pay

## 2024-05-16 ENCOUNTER — Encounter (HOSPITAL_COMMUNITY): Payer: Self-pay

## 2024-05-16 ENCOUNTER — Emergency Department (HOSPITAL_COMMUNITY)

## 2024-05-16 DIAGNOSIS — Z79899 Other long term (current) drug therapy: Secondary | ICD-10-CM | POA: Insufficient documentation

## 2024-05-16 DIAGNOSIS — R911 Solitary pulmonary nodule: Secondary | ICD-10-CM | POA: Insufficient documentation

## 2024-05-16 DIAGNOSIS — R2243 Localized swelling, mass and lump, lower limb, bilateral: Secondary | ICD-10-CM | POA: Diagnosis not present

## 2024-05-16 DIAGNOSIS — I700 Atherosclerosis of aorta: Secondary | ICD-10-CM | POA: Diagnosis not present

## 2024-05-16 DIAGNOSIS — R918 Other nonspecific abnormal finding of lung field: Secondary | ICD-10-CM | POA: Diagnosis not present

## 2024-05-16 DIAGNOSIS — R0789 Other chest pain: Secondary | ICD-10-CM | POA: Diagnosis not present

## 2024-05-16 DIAGNOSIS — R079 Chest pain, unspecified: Secondary | ICD-10-CM

## 2024-05-16 LAB — TROPONIN I (HIGH SENSITIVITY)
Troponin I (High Sensitivity): 10 ng/L (ref <18)
Troponin I (High Sensitivity): 8 ng/L (ref <18)

## 2024-05-16 LAB — CBC
HCT: 36.7 % (ref 36.0–46.0)
Hemoglobin: 12.3 g/dL (ref 12.0–15.0)
MCH: 30.3 pg (ref 26.0–34.0)
MCHC: 33.5 g/dL (ref 30.0–36.0)
MCV: 90.4 fL (ref 80.0–100.0)
Platelets: 205 K/uL (ref 150–400)
RBC: 4.06 MIL/uL (ref 3.87–5.11)
RDW: 13.2 % (ref 11.5–15.5)
WBC: 7.4 K/uL (ref 4.0–10.5)
nRBC: 0 % (ref 0.0–0.2)

## 2024-05-16 LAB — BASIC METABOLIC PANEL WITH GFR
Anion gap: 11 (ref 5–15)
BUN: 20 mg/dL (ref 8–23)
CO2: 21 mmol/L — ABNORMAL LOW (ref 22–32)
Calcium: 9.3 mg/dL (ref 8.9–10.3)
Chloride: 107 mmol/L (ref 98–111)
Creatinine, Ser: 0.78 mg/dL (ref 0.44–1.00)
GFR, Estimated: >60 mL/min (ref >60)
Glucose, Bld: 102 mg/dL — ABNORMAL HIGH (ref 70–99)
Potassium: 4.1 mmol/L (ref 3.5–5.1)
Sodium: 139 mmol/L (ref 135–145)

## 2024-05-16 LAB — BRAIN NATRIURETIC PEPTIDE: B Natriuretic Peptide: 70.3 pg/mL (ref 0.0–100.0)

## 2024-05-16 MED ORDER — IOHEXOL 350 MG/ML SOLN
50.0000 mL | Freq: Once | INTRAVENOUS | Status: AC | PRN
  Administered 2024-05-16: 50 mL via INTRAVENOUS

## 2024-05-16 NOTE — ED Provider Notes (Signed)
 Tammy Riley EMERGENCY DEPARTMENT AT [facility] Provider Note   CSN: [id number] Arrival date & time: 05/16/24  8046     Patient presents with: Chest Pain   Tammy Riley is a 83 y.o. female Chest pain, starting at 6:30 pm. Central, radiating straight to back. No other radiation to arm or jaw. Got 324 asa prior to arrival. Reports home BP and HR elevated -- 170/70 at home. Standing in kitchen cooking dinner. Walked this morning. Lasted about 30 minutes, resolved at this time. No dizziness, no shob, no dizziness, no sweating, no anxiousness. No vision changes, numbnes, tingling. Has had some leg swelling since January. No pain with walking. Remote stress test in 2015 normal.      Chest Pain      Prior to Admission medications   Medication Sig Start Date End Date Taking? Authorizing Provider  levothyroxine (SYNTHROID) 88 MCG tablet Take 88 mcg by mouth daily before breakfast.   Yes [provider]  acetaminophen (TYLENOL) 500 MG tablet Take 500 mg by mouth at bedtime.     [provider]  Cholecalciferol (VITAMIN D3 PO) Take by mouth.    [provider]  LORazepam (ATIVAN) 0.5 MG tablet Take 0.5 mg by mouth at bedtime.     [provider]  Omega-3 Fatty Acids (FISH OIL) 1200 MG CAPS Take 1 capsule by mouth daily.     [provider]  Propylene Glycol (SYSTANE BALANCE OP) Apply to eye.    [provider]    Allergies: Patient has no known allergies.    Review of Systems  Cardiovascular:  Positive for chest pain.  All other systems reviewed and are negative.   Updated Vital Signs BP (!) 149/66   Pulse 64   Temp 97.7 F (36.5 C) (Oral)   Resp 18   Ht 5' (1.524 m)   Wt 44.8 kg   SpO2 100%   BMI 19.30 kg/m   Physical Exam Vitals and nursing note reviewed.  Constitutional:      General: She is not in acute distress.    Appearance: Normal appearance.  HENT:     Head: Normocephalic and atraumatic.  Eyes:      General:        Right eye: No discharge.        Left eye: No discharge.  Cardiovascular:     Rate and Rhythm: Normal rate and regular rhythm.     Heart sounds: No murmur heard.    No friction rub. No gallop.  Pulmonary:     Effort: Pulmonary effort is normal.     Breath sounds: Normal breath sounds.  Abdominal:     General: Bowel sounds are normal.     Palpations: Abdomen is soft.  Skin:    General: Skin is warm and dry.     Capillary Refill: Capillary refill takes less than 2 seconds.  Neurological:     Mental Status: She is alert and oriented to person, place, and time.  Psychiatric:        Mood and Affect: Mood normal.        Behavior: Behavior normal.     (all labs ordered are listed, but only abnormal results are displayed) Labs Reviewed  BASIC METABOLIC PANEL WITH GFR - Abnormal; Notable for the following components:      Result Value   CO2 21 (*)    Glucose, Bld 102 (*)    All other components within normal limits  CBC  BRAIN NATRIURETIC PEPTIDE  TROPONIN I (HIGH SENSITIVITY)  TROPONIN I (HIGH SENSITIVITY)    EKG: EKG Interpretation Date/Time:  Wednesday May 16 2024 21:10:53 EDT Ventricular Rate:  57 PR Interval:  170 QRS Duration:  95 QT Interval:  396 QTC Calculation: 386 R Axis:   -29  Text Interpretation: Sinus rhythm Ventricular premature complex Borderline left axis deviation RSR' in V1 or V2, probably normal variant Confirmed by Franklyn Gills [phone]) on 05/16/2024 9:18:06 PM  Radiology: CT Angio Chest Aorta W and/or Wo Contrast Result Date: 05/16/2024 CLINICAL DATA:  Acute aortic syndrome suspected. EXAM: CT ANGIOGRAPHY CHEST WITH CONTRAST TECHNIQUE: Multidetector CT imaging of the chest was performed using the standard protocol during bolus administration of intravenous contrast. Multiplanar CT image reconstructions and MIPs were obtained to evaluate the vascular anatomy. RADIATION DOSE REDUCTION: This exam was performed according to the  departmental dose-optimization program which includes automated exposure control, adjustment of the mA and/or kV according to patient size and/or use of iterative reconstruction technique. CONTRAST:  50mL OMNIPAQUE  IOHEXOL  350 MG/ML SOLN COMPARISON:  None Available. FINDINGS: Cardiovascular: Preferential opacification of the thoracic aorta. No evidence of thoracic aortic aneurysm or dissection. Normal heart size. No pericardial effusion. There is mild calcified atherosclerotic disease throughout the aorta. Origin of the great vessels appears within normal limits. Mediastinum/Nodes: Thyroid  gland is not visualized. There are no enlarged mediastinal or hilar lymph nodes. Visualized esophagus is within normal limits. Lungs/Pleura: There are pleural-parenchymal opacities in both lung apices, right greater than left. There some mild patchy ground-glass opacities in both inferior lower lobes, right greater than left. There is no pleural effusion or pneumothorax. There is a right upper lobe nodular density measuring 3 mm image 6/26. Upper Abdomen: No acute abnormality. Musculoskeletal: No acute osseous findings. Review of the MIP images confirms the above findings. IMPRESSION: 1. No evidence for aortic dissection or aneurysm. 2. Mild patchy ground-glass opacities in both inferior lower lobes, right greater than left, may be infectious/inflammatory. 3. Pleural-parenchymal opacities in both lung apices, right greater than left, likely scarring. 4. 3 mm right solid pulmonary nodule within the upper lobe. No follow-up needed if patient is low-risk.This recommendation follows the consensus statement: Guidelines for Management of Incidental Pulmonary Nodules Detected on CT Images: From the Fleischner Society 2017; Radiology 2017; 284:228-243. 5. Aortic atherosclerosis. Aortic Atherosclerosis (ICD10-I70.0). Electronically Signed   By: Greig Pique M.D.   On: 05/16/2024 22:58   DG Chest 2 View Result Date: 05/16/2024 CLINICAL  DATA:  Chest pain EXAM: CHEST - 2 VIEW COMPARISON:  Chest x-ray 10/26/2014 FINDINGS: The heart size and mediastinal contours are within normal limits. Both lungs are clear. The visualized skeletal structures are unremarkable. IMPRESSION: No active cardiopulmonary disease. Electronically Signed   By: Greig Pique M.D.   On: 05/16/2024 21:27     Procedures   Medications Ordered in the ED  iohexol  (OMNIPAQUE ) 350 MG/ML injection 50 mL (50 mLs Intravenous Contrast Given 05/16/24 2223)                                    Medical Decision Making Amount and/or Complexity of Data Reviewed Labs: ordered. Radiology: ordered.   This patient is a 83 y.o. female  who presents to the ED for concern of chest pain.   Differential diagnoses prior to evaluation: The emergent differential diagnosis includes, but is not limited to,  ACS, AAS, PE, Mallory-Weiss, Boerhaave's, Pneumonia, acute bronchitis,  asthma or COPD exacerbation, anxiety, MSK pain or traumatic injury to the chest, acid reflux versus other . This is not an exhaustive differential.   Past Medical History / Co-morbidities / Social History: Some family history of ACS, father died from heart attack in 59s.  Otherwise noncontributory, no previous history of hypertension, hyperlipidemia, tobacco use, diabetes.  Does have history of hypothyroidism, compliant on medications.  Physical Exam: Physical exam performed. The pertinent findings include: Some hypertension on ED arrival, blood pressure 158/67, 149/66 on recheck, normal heart rate and rhythm, vital signs otherwise stable.  Lab Tests/Imaging studies: I personally interpreted labs/imaging and the pertinent results include: CBC unremarkable, BMP with very mild bicarb deficit, CO2 21, otherwise unremarkable, initial troponin 10, normal BNP at 70.3.  Plain film chest x-ray with no evidence of acute intrathoracic abnormality.  CT angio of the chest with  1. No evidence for aortic dissection or  aneurysm.  2. Mild patchy ground-glass opacities in both inferior lower lobes,  right greater than left, may be infectious/inflammatory.  3. Pleural-parenchymal opacities in both lung apices, right greater  than left, likely scarring.  4. 3 mm right solid pulmonary nodule within the upper lobe. No  follow-up needed if patient is low-risk.This recommendation follows  the consensus statement: Guidelines for Management of Incidental  Pulmonary Nodules Detected on CT Images: From the Fleischner Society  . I agree with the radiologist interpretation.  Cardiac monitoring: EKG obtained and interpreted by myself and attending physician which shows: Normal sinus rhythm, some PVCs, no acute ST-T changes   Medications: Encouraged outpatient cardiology follow up   Disposition: After consideration of the diagnostic results and the patients response to treatment, I feel that patient stable for discharge with PCP/cardiology follow-up, pain remains resolved at time of reevaluation.   emergency department workup does not suggest an emergent condition requiring admission or immediate intervention beyond what has been performed at this time. The plan is: as above. The patient is safe for discharge and has been instructed to return immediately for worsening symptoms, change in symptoms or any other concerns.   Final diagnoses:  Chest pain, unspecified type  Pulmonary nodule    ED Discharge Orders     None          Rosan Sherlean DEL, PA-C 05/16/24 2325    Franklyn Sid SAILOR, MD 05/17/24 1529

## 2024-05-16 NOTE — Discharge Instructions (Addendum)
 Your workup today was reassuring.  There was a nonspecific pulmonary nodule noted in your right upper lung.  Based on the size and appearance on the CT no additional follow-up was recommended by the radiologist.  Given you do have some family history of cancer, if you do want to get a repeat image in 6 to 12 months to ensure that your lesion has not grown I think this would be reasonable.  There was no evidence of dissection, heart attack.  I do think it would be reasonable to follow-up with your primary care doctor, or cardiologist for a repeat elevated stress test.

## 2024-05-16 NOTE — ED Triage Notes (Signed)
 Pt bib [facility] from home where she started having centralized chest pain about 30 min prior to arrival. States she was just standing in the kitchen cooking dinner. Pt has no cardiac history and states this has never happened before. Pt states that she did walk about 3 miles this morning for her walking club but nothing out of her normal. Received 324mg  ASA en route. No pain or complaints on arrival to ED

## 2024-05-21 DIAGNOSIS — R030 Elevated blood-pressure reading, without diagnosis of hypertension: Secondary | ICD-10-CM | POA: Diagnosis not present

## 2024-05-21 DIAGNOSIS — Z23 Encounter for immunization: Secondary | ICD-10-CM | POA: Diagnosis not present

## 2024-05-21 DIAGNOSIS — R911 Solitary pulmonary nodule: Secondary | ICD-10-CM | POA: Diagnosis not present

## 2024-05-21 DIAGNOSIS — L237 Allergic contact dermatitis due to plants, except food: Secondary | ICD-10-CM | POA: Diagnosis not present

## 2024-05-21 DIAGNOSIS — R0789 Other chest pain: Secondary | ICD-10-CM | POA: Diagnosis not present

## 2024-05-21 DIAGNOSIS — R600 Localized edema: Secondary | ICD-10-CM | POA: Diagnosis not present
